# Patient Record
Sex: Male | Born: 1945 | Race: White | Hispanic: No | Marital: Married | Smoking: Never smoker
Health system: Southern US, Community
[De-identification: ages and names within clinical notes are randomized; demographics above are authoritative.]

## PROBLEM LIST (undated history)

## (undated) DIAGNOSIS — J309 Allergic rhinitis, unspecified: Secondary | ICD-10-CM

## (undated) DIAGNOSIS — C61 Malignant neoplasm of prostate: Secondary | ICD-10-CM

## (undated) DIAGNOSIS — E785 Hyperlipidemia, unspecified: Secondary | ICD-10-CM

## (undated) DIAGNOSIS — IMO0002 Reserved for concepts with insufficient information to code with codable children: Secondary | ICD-10-CM

## (undated) DIAGNOSIS — K219 Gastro-esophageal reflux disease without esophagitis: Secondary | ICD-10-CM

## (undated) HISTORY — PX: PROSTATE BIOPSY: SHX241

## (undated) HISTORY — PX: OTHER SURGICAL HISTORY: SHX169

## (undated) HISTORY — DX: Hyperlipidemia, unspecified: E78.5

## (undated) HISTORY — DX: Reserved for concepts with insufficient information to code with codable children: IMO0002

## (undated) HISTORY — DX: Allergic rhinitis, unspecified: J30.9

## (undated) HISTORY — DX: Gastro-esophageal reflux disease without esophagitis: K21.9

---

## 2000-01-04 ENCOUNTER — Ambulatory Visit (HOSPITAL_BASED_OUTPATIENT_CLINIC_OR_DEPARTMENT_OTHER): Admission: RE | Admit: 2000-01-04 | Discharge: 2000-01-04 | Payer: Self-pay | Admitting: Plastic Surgery

## 2006-01-25 ENCOUNTER — Encounter: Admission: RE | Admit: 2006-01-25 | Discharge: 2006-01-25 | Payer: Self-pay | Admitting: Orthopedic Surgery

## 2006-08-02 ENCOUNTER — Encounter: Admission: RE | Admit: 2006-08-02 | Discharge: 2006-08-02 | Payer: Self-pay | Admitting: Specialist

## 2012-02-22 ENCOUNTER — Encounter: Payer: Self-pay | Admitting: Internal Medicine

## 2012-10-05 ENCOUNTER — Encounter: Payer: Self-pay | Admitting: Internal Medicine

## 2015-01-22 ENCOUNTER — Ambulatory Visit (INDEPENDENT_AMBULATORY_CARE_PROVIDER_SITE_OTHER): Payer: Medicare Other | Admitting: Cardiology

## 2015-01-22 ENCOUNTER — Encounter: Payer: Self-pay | Admitting: Cardiology

## 2015-01-22 VITALS — BP 112/66 | HR 72 | Ht 70.0 in | Wt 145.1 lb

## 2015-01-22 DIAGNOSIS — Z8249 Family history of ischemic heart disease and other diseases of the circulatory system: Secondary | ICD-10-CM

## 2015-01-22 DIAGNOSIS — I10 Essential (primary) hypertension: Secondary | ICD-10-CM | POA: Insufficient documentation

## 2015-01-22 DIAGNOSIS — E785 Hyperlipidemia, unspecified: Secondary | ICD-10-CM | POA: Diagnosis not present

## 2015-01-22 NOTE — Progress Notes (Signed)
Cardiology Office Note   Date:  01/22/2015   ID:  Alexander Reynolds, DOB Nov 01, 1945, MRN [id number]  PCP:  Cari Caraway, MD  Cardiologist:   Candee Furbish, MD       History of Present Illness: Alexander Reynolds is a 69 y.o. male who presents for evaluation for prevention of coronary artery disease. Has a history of hyperlipidemia, Niaspan, Crestor, hypertension on lisinopril.  Overall he is been doing fairly well since our last cardio evaluation in 2011. He had a nuclear stress test done in the past, low risk, possible diaphragmatic attenuation.  He bikes a couple times a week, 15 miles in the morning. No significant anginal symptoms. No shortness of breath.   Creatinine 1.09, ALT 30, PSA normal, nonsmoker. Father died when he was 15 with prostate cancer mother had atrial fibrillation at age 23. LDL cholesterol is 114 has been on Crestor 20 mg 1 tablet for 3 days, half tablet for 4 days. Also on Niaspan as well. We discussed particle size.  When taking higher dose atorvastatin or continuous higher dose Crestor, he has a sensation in his chest wall/rib cage like he is unable to take a deep breath.    Past Medical History  Diagnosis Date  . Hyperlipidemia   . GERD (gastroesophageal reflux disease)   . Squamous cell carcinoma   . Allergic rhinitis     Past Surgical History  Procedure Laterality Date  . Skin cancer removals       Current Outpatient Prescriptions  Medication Sig Dispense Refill  . aspirin 81 MG tablet Take 81 mg by mouth daily.    . calcium carbonate (OS-CAL) 600 MG TABS tablet Take 600 mg by mouth 2 (two) times daily with a meal.    . Cholecalciferol (VITAMIN D) 2000 UNITS CAPS Take 2,000 Units by mouth daily.    . fluticasone (FLONASE) 50 MCG/ACT nasal spray Place into both nostrils daily.    Marland Kitchen lisinopril (PRINIVIL,ZESTRIL) 5 MG tablet Take 5 mg by mouth daily.    Marland Kitchen LORazepam (ATIVAN) 0.5 MG tablet Take 0.5 mg by mouth 3 (three) times daily as needed.  (anxiety)    . Multiple Vitamin (MULTIVITAMIN) capsule Take 1 capsule by mouth daily.    . niacin (NIASPAN) 1000 MG CR tablet Take 2,000 mg by mouth at bedtime.     . pantoprazole (PROTONIX) 40 MG tablet Take 40 mg by mouth daily.    . rosuvastatin (CRESTOR) 20 MG tablet Take one (1) tablet (20 mg total) by mouth alternating with half tablet (10 mg total) by mouth daily.     No current facility-administered medications for this visit.    Allergies:   Lipitor and Tetracyclines & related    Social History:  The patient  reports that he has never smoked. He has never used smokeless tobacco.   Family History:  The patient's family history includes Atrial fibrillation in his mother; Diabetes in his father; Heart failure in his father; Leukemia in his mother; Prostate cancer in his father. hyperlipidemia   ROS:  Please see the history of present illness.   Otherwise, review of systems are positive for none.   All other systems are reviewed and negative.    PHYSICAL EXAM: VS:  BP 112/66 mmHg  Pulse 72  Ht 5\' 10"  (1.778 m)  Wt 145 lb 1.9 oz (65.826 kg)  BMI 20.82 kg/m2 , BMI Body mass index is 20.82 kg/(m^2). GEN: Thin, in no acute distress HEENT: normal Neck: no JVD,  carotid bruits, or masses Cardiac: RRR; no murmurs, rubs, or gallops,no edema  Respiratory:  clear to auscultation bilaterally, normal work of breathing GI: soft, nontender, nondistended, + BS MS: no deformity or atrophy Skin: warm and dry, no rash Neuro:  Strength and sensation are intact Psych: euthymic mood, full affect   EKG:  EKG is ordered today. The ekg ordered today demonstrates 01/22/15-sinus rhythm, 72, no other abnormalities   Recent Labs: No results found for requested labs within last 365 days.    Lipid Panel No results found for: CHOL, TRIG, HDL, CHOLHDL, VLDL, LDLCALC, LDLDIRECT    Wt Readings from Last 3 Encounters:  01/22/15 145 lb 1.9 oz (65.826 kg)      Other studies Reviewed: Additional  studies/ records that were reviewed today include: Prior lab work, prior office notes. Review of the above records demonstrates: As above   ASSESSMENT AND PLAN:  1.  Family history of CAD/hyperlipidemia/atrial fibrillation/heart failure-it is been several years since prior evaluation, we will go ahead and check an exercise treadmill test to ensure that there are no high risk features. He is quite active, cycling. We want to ensure that he is safe during this activity.  2. Hyperlipidemia-writer intolerance to higher dose atorvastatin. He seems to be tolerating his Crestor with his current regimen well. We discussed Niaspan and I have no objection to continuing at this time.  3. Essential hypertension-currently excellent control with 5 mg lisinopril. Upon next visit with Dr. Leonides Schanz, He may actually be able to discontinu low-dose lisinopril. This was started surrounding an orthopedic visit and increased stress.   Current medicines are reviewed at length with the patient today.  The patient does not have concerns regarding medicines.  The following changes have been made:  no change  Labs/ tests ordered today include: Exercise treadmill test   Orders Placed This Encounter  Procedures  . Exercise Tolerance Test  . EKG 12-Lead     Disposition:   FU with Emonii Wienke in 1 year  Signed, Candee Furbish, MD  01/22/2015 12:15 PM    [facility] [place], [place], [place]  [address] Phone: [phone]; Fax: [phone]

## 2015-01-22 NOTE — Patient Instructions (Signed)
Medication Instructions:  Your physician recommends that you continue on your current medications as directed. Please refer to the Current Medication list given to you today.  Testing/Procedures: Your physician has requested that you have an exercise tolerance test. For further information please visit [email or web address]. Please also follow instruction sheet, as given.  Follow-Up: Follow up in 1 year with Dr. Marlou Porch.  You will receive a letter in the mail 2 months before you are due.  Please call us when you receive this letter to schedule your follow up appointment.  Thank you for choosing [facility]!!

## 2015-02-11 ENCOUNTER — Telehealth (HOSPITAL_COMMUNITY): Payer: Self-pay

## 2015-02-11 NOTE — Telephone Encounter (Signed)
Encounter complete. 

## 2015-02-13 ENCOUNTER — Ambulatory Visit (HOSPITAL_COMMUNITY)
Admission: RE | Admit: 2015-02-13 | Discharge: 2015-02-13 | Disposition: A | Discharge status: Home / Self Care | Payer: Medicare Other | Source: Ambulatory Visit | Attending: Cardiovascular Disease | Admitting: Cardiovascular Disease

## 2015-02-13 DIAGNOSIS — Z8249 Family history of ischemic heart disease and other diseases of the circulatory system: Secondary | ICD-10-CM | POA: Diagnosis not present

## 2015-02-14 LAB — EXERCISE TOLERANCE TEST
CHL CUP MPHR: 152 {beats}/min
CSEPED: 7 min
CSEPEDS: 1 s
CSEPEW: 8.5 METS
CSEPPHR: 143 {beats}/min
Percent HR: 93 %
RPE: 15
Rest HR: 72 {beats}/min

## 2016-04-07 ENCOUNTER — Ambulatory Visit (INDEPENDENT_AMBULATORY_CARE_PROVIDER_SITE_OTHER): Payer: Medicare Other | Admitting: Cardiology

## 2016-04-07 ENCOUNTER — Encounter: Payer: Self-pay | Admitting: Cardiology

## 2016-04-07 VITALS — BP 124/72 | HR 73 | Ht 69.5 in | Wt 149.6 lb

## 2016-04-07 DIAGNOSIS — I10 Essential (primary) hypertension: Secondary | ICD-10-CM

## 2016-04-07 DIAGNOSIS — E7800 Pure hypercholesterolemia, unspecified: Secondary | ICD-10-CM

## 2016-04-07 DIAGNOSIS — Z8249 Family history of ischemic heart disease and other diseases of the circulatory system: Secondary | ICD-10-CM | POA: Diagnosis not present

## 2016-04-07 NOTE — Progress Notes (Signed)
Cardiology Office Note   Date:  04/07/2016   ID:  GREGORY KOLLMANN, DOB 1945-08-21, MRN [id number]  PCP:  Cari Caraway, MD  Cardiologist:   Candee Furbish, MD       History of Present Illness: Alexander Reynolds is a 70 y.o. male who presents for evaluation for prevention of coronary artery disease. Has a history of hyperlipidemia, Niaspan, Crestor, hypertension on lisinopril.  Overall he is been doing fairly well since our last cardio evaluation. He had a nuclear stress test done in the past, low risk, possible diaphragmatic attenuation. Exercise treadmill test in 2016 was reassuring.  He bikes a couple times a week, 15 miles in the morning. No significant anginal symptoms. No shortness of breath.   Creatinine 1.09, ALT 30, PSA normal, nonsmoker. Father died when he was 37 with prostate cancer mother had atrial fibrillation at age 54. LDL cholesterol is 114 has been on Crestor 20 mg 1 tablet for 3 days, half tablet for 4 days. Also on Niaspan as well. We discussed particle size/trial data.  When taking higher dose atorvastatin or continuous higher dose Crestor, he has a sensation in his chest wall/rib cage like he is unable to take a deep breath.  He has an occasional right flank/rib cage discomfort latissimus dorsi. Fleeting. Musculoskeletal. No associated symptoms.    Past Medical History:  Diagnosis Date  . Allergic rhinitis   . GERD (gastroesophageal reflux disease)   . Hyperlipidemia   . Squamous cell carcinoma     Past Surgical History:  Procedure Laterality Date  . skin cancer removals       Current Outpatient Prescriptions  Medication Sig Dispense Refill  . aspirin 81 MG tablet Take 81 mg by mouth. Patient takes on Sat, Sun Tues, Thurs    . calcium carbonate (OS-CAL) 600 MG TABS tablet Take 600 mg by mouth 2 (two) times daily with a meal.    . Cholecalciferol (VITAMIN D) 2000 UNITS CAPS Take 2,000 Units by mouth daily.    . fluticasone (FLONASE) 50 MCG/ACT nasal  spray Place into both nostrils daily.    Marland Kitchen lisinopril (PRINIVIL,ZESTRIL) 5 MG tablet Take 5 mg by mouth daily.    Marland Kitchen LORazepam (ATIVAN) 0.5 MG tablet Take 0.5 mg by mouth 3 (three) times daily as needed. (anxiety)    . Multiple Vitamin (MULTIVITAMIN) capsule Take 1 capsule by mouth daily.    . niacin (NIASPAN) 1000 MG CR tablet Take 2,000 mg by mouth at bedtime.     . pantoprazole (PROTONIX) 40 MG tablet Take 40 mg by mouth. Patient takes Mon, Wed, Fri    . rosuvastatin (CRESTOR) 20 MG tablet Take one (1) tablet (20 mg total) by mouth alternating with half tablet (10 mg total) by mouth daily.     No current facility-administered medications for this visit.     Allergies:   Lipitor [atorvastatin] and Tetracyclines & related    Social History:  The patient  reports that he has never smoked. He has never used smokeless tobacco.   Family History:  The patient's family history includes Atrial fibrillation in his mother; Diabetes in his father; Heart failure in his father; Leukemia in his mother; Prostate cancer in his father. hyperlipidemia   ROS:  Please see the history of present illness.   Otherwise, review of systems are positive for none.   All other systems are reviewed and negative.    PHYSICAL EXAM: VS:  BP 124/72   Pulse 73   Ht  5' 9.5" (1.765 m)   Wt 149 lb 9.6 oz (67.9 kg)   BMI 21.78 kg/m  , BMI Body mass index is 21.78 kg/m. GEN: Thin, in no acute distress  HEENT: normal  Neck: no JVD, carotid bruits, or masses Cardiac: RRR; no murmurs, rubs, or gallops,no edema  Respiratory:  clear to auscultation bilaterally, normal work of breathing GI: soft, nontender, nondistended, + BS MS: no deformity or atrophy  Skin: warm and dry, no rash Neuro:  Strength and sensation are intact Psych: euthymic mood, full affect   EKG:  EKG is ordered today. The ekg ordered today demonstrates 04/07/16-sinus rhythm 73 with no other abnormality's personally reviewed-prior 01/22/15-sinus rhythm,  72, no other abnormalities   Recent Labs: No results found for requested labs within last 8760 hours.    Lipid Panel No results found for: CHOL, TRIG, HDL, CHOLHDL, VLDL, LDLCALC, LDLDIRECT    Wt Readings from Last 3 Encounters:  04/07/16 149 lb 9.6 oz (67.9 kg)  01/22/15 145 lb 1.9 oz (65.8 kg)      Other studies Reviewed: Additional studies/ records that were reviewed today include: Prior lab work, prior office notes. Review of the above records demonstrates: As above  ETT 02/13/15: There was no ST segment deviation noted during stress.  Arrhythmias during stress: none.  Arrhythmias during recovery: none.  There were no significant arrhythmias noted during the test.  ECG was interpretable and conclusive.  The patient had a normal exercise tolerance. There was no chest pain. There was an appropriate level of dyspnea. There were no arrhythmias, a normal heart rate response and normal BP response. There were no ischemic ST T wave changes and a normal heart rate recovery.  Negative adequate POET (Plain Old Exercise Treadmill)  ASSESSMENT AND PLAN:  1.  Family history of CAD/hyperlipidemia/atrial fibrillation/heart failure-i exercise treadmill test reassuring in 2106.  He is quite active, cycling.   2. Hyperlipidemia- intolerance to higher dose atorvastatin. He seems to be tolerating his Crestor with his current regimen well 20 for 4 and 10 for 3. We discussed Niaspan and I have no objection to continuing at this time (data neutral for MI protection).  3. Essential hypertension-currently excellent control with 5 mg lisinopril. Upon next visit with Dr. Leonides Schanz, He may actually be able to discontinu low-dose lisinopril. This was started surrounding increased stress (daughter separation).   Current medicines are reviewed at length with the patient today.  The patient does not have concerns regarding medicines.   Orders Placed This Encounter  Procedures  . EKG 12-Lead      Disposition:   FU with Alga [place] in 2 years. No need for stress test at this time  Signed, Candee Furbish, MD  04/07/2016 10:02 AM    [facility] [place], [place], [place]  [address] Phone: [phone]; Fax: [phone]

## 2016-04-07 NOTE — Patient Instructions (Signed)
Medication Instructions:  The current medical regimen is effective;  continue present plan and medications.  Follow-Up: Follow up in 2 years with Dr. Skains.  You will receive a letter in the mail 2 months before you are due.  Please call us when you receive this letter to schedule your follow up appointment.  If you need a refill on your cardiac medications before your next appointment, please call your pharmacy.  Thank you for choosing [facility]!!     

## 2016-11-11 ENCOUNTER — Ambulatory Visit (INDEPENDENT_AMBULATORY_CARE_PROVIDER_SITE_OTHER): Payer: Medicare Other | Admitting: Orthopaedic Surgery

## 2016-11-11 ENCOUNTER — Ambulatory Visit (INDEPENDENT_AMBULATORY_CARE_PROVIDER_SITE_OTHER): Payer: Medicare Other

## 2016-11-11 ENCOUNTER — Encounter (INDEPENDENT_AMBULATORY_CARE_PROVIDER_SITE_OTHER): Payer: Self-pay | Admitting: Orthopaedic Surgery

## 2016-11-11 VITALS — BP 128/73 | HR 70 | Resp 14 | Ht 70.0 in | Wt 150.0 lb

## 2016-11-11 DIAGNOSIS — R0781 Pleurodynia: Secondary | ICD-10-CM | POA: Diagnosis not present

## 2016-11-11 DIAGNOSIS — S20211A Contusion of right front wall of thorax, initial encounter: Secondary | ICD-10-CM | POA: Diagnosis not present

## 2016-11-11 NOTE — Progress Notes (Signed)
Office Visit Note   Patient: Alexander Reynolds           Date of Birth: 04-01-1946           MRN: [id number] Visit Date: 11/11/2016              Requested by: Cari Caraway, [place], [place] [address] PCP: Cari Caraway, MD   Assessment & Plan: Visit Diagnoses:  1. Rib contusion, right, initial encounter   2. Rib pain on right side     Plan:  #1: No treatment at this time since he is improving. #2: If he continues to have pain or worsens then we may need to consider a CT scanning or bone scanning.  Follow-Up Instructions: Return if symptoms worsen or fail to improve.   Orders:  Orders Placed This Encounter  Procedures  . XR Ribs Unilateral Right   No orders of the defined types were placed in this encounter.     Procedures: No procedures performed   Clinical Data: No additional findings.   Subjective: Chief Complaint  Patient presents with  . Chest - Injury, Pain    Alexander Reynolds is a 71 y o that presents with Right rib pain. He relates 10 days ago he "bumped" a desk but pain when taking deep breath.    Alexander Reynolds is a 71 year old white male who is seen today for evaluation of his right upper and lateral chest pain. Apparently 10 days ago he was walking and stumbled and caught a high table corner into his right lateral and anterior ribs. He didn't think much about that today he did not have excruciating pain and continued on. However then he started having a little bit more pain. He has even gone off golf without pain. He has a trip coming up to [place] and doing golfing out there concerned and so he Reynolds today for evaluation. Today he has minimal pain by his history. Denies shortness of breath or dyspnea.    Review of Systems  All other systems reviewed and are negative.    Objective: Vital Signs: BP 128/73   Pulse 70   Resp 14   Ht 5\' 10"  (1.778 m)   Wt 150 lb (68 kg)   BMI 21.52 kg/m   Physical Exam  Constitutional: He is oriented  to person, place, and time. He appears well-developed and well-nourished.  HENT:  Head: Normocephalic and atraumatic.  Eyes: EOM are normal. Pupils are equal, round, and reactive to light.  Pulmonary/Chest: Effort normal.  Neurological: He is alert and oriented to person, place, and time.  Skin: Skin is warm and dry.  Psychiatric: He has a normal mood and affect. His behavior is normal. Judgment and thought content normal.    Ortho Exam he is minimally tender over the area of the anterior and lateral aspect of the right hemithorax. A marker was placed for x-ray review. At that place he has minimal tenderness and this seems to be more over the a bony prominence. He doesn't have intercostal pain. He can take a deep breath without having any pain. He denies shortness of breath that here in the office also  Specialty Comments:  No specialty comments available.  Imaging: Xr Ribs Unilateral Right  Result Date: 11/11/2016 Two-view chest x-ray and rib details with marker does not reveal any occult fracture at this time. This was reviewed both by Dr. Durward Fortes myself. Lung markings are appropriate. No effusions noted.    [place]  History: Patient Active Problem List   Diagnosis Date Noted  . Family history of coronary artery disease 01/22/2015  . Essential hypertension 01/22/2015  . Hyperlipidemia 01/22/2015   Past Medical History:  Diagnosis Date  . Allergic rhinitis   . GERD (gastroesophageal reflux disease)   . Hyperlipidemia   . Squamous cell carcinoma     Family History  Problem Relation Age of Onset  . Atrial fibrillation Mother   . Leukemia Mother   . Prostate cancer Father   . Heart failure Father   . Diabetes Father     Past Surgical History:  Procedure Laterality Date  . skin cancer removals     Social History   Occupational History  . Not on file.   Social History Main Topics  . Smoking status: Never Smoker  . Smokeless tobacco: Never Used  . Alcohol use Not on  file  . Drug use: Unknown  . Sexual activity: Not on file

## 2016-12-24 NOTE — Addendum Note (Signed)
Addended by: Mervyn Skeeters on: 12/24/2016 12:26 PM   Modules accepted: Level of Service

## 2018-04-03 ENCOUNTER — Encounter: Payer: Self-pay | Admitting: Cardiology

## 2018-04-25 ENCOUNTER — Encounter: Payer: Self-pay | Admitting: Cardiology

## 2018-04-25 ENCOUNTER — Ambulatory Visit: Payer: Medicare Other | Admitting: Cardiology

## 2018-04-25 VITALS — BP 122/76 | HR 73 | Ht 70.0 in | Wt 151.0 lb

## 2018-04-25 DIAGNOSIS — I10 Essential (primary) hypertension: Secondary | ICD-10-CM

## 2018-04-25 DIAGNOSIS — E7800 Pure hypercholesterolemia, unspecified: Secondary | ICD-10-CM | POA: Diagnosis not present

## 2018-04-25 DIAGNOSIS — Z8249 Family history of ischemic heart disease and other diseases of the circulatory system: Secondary | ICD-10-CM | POA: Diagnosis not present

## 2018-04-25 NOTE — Patient Instructions (Signed)
Medication Instructions:  The current medical regimen is effective;  continue present plan and medications.  If you need a refill on your cardiac medications before your next appointment, please call your pharmacy.   Testing/Procedures: Your physician has requested that you have calcium score.  This is completed by cardiac computed tomography (CT) which is a painless test that uses an x-ray machine to take clear, detailed pictures of your heart. The cost of this test is $150.00 and is not covered by your insurance.  You have been referred to the [facility].   Follow-Up: At [facility], you and your health needs are our priority.  As part of our continuing mission to provide you with exceptional heart care, we have created designated Provider Care Teams.  These Care Teams include your primary Cardiologist (physician) and Advanced Practice Providers (APPs -  Physician Assistants and Nurse Practitioners) who all work together to provide you with the care you need, when you need it. You will need a follow up appointment in 12 months.  Please call our office 2 months in advance to schedule this appointment.  You may see Dr Marlou Porch. or one of the following Advanced Practice Providers on your designated Care Team:   Truitt Merle, NP Cecilie Kicks, NP . Kathyrn Drown, NP  Thank you for choosing [facility]!!

## 2018-04-25 NOTE — Progress Notes (Signed)
Cardiology Office Note   Date:  04/25/2018   ID:  [name], DOB 12/11/1945, MRN [id number]  PCP:  Cari Caraway, MD  Cardiologist:   Candee Furbish, MD       History of Present Illness: Alexander Reynolds is a 72 y.o. male who presents for follow-up of prevention of coronary artery disease. Has a history of hyperlipidemia, Niaspan, Crestor, hypertension on lisinopril.  He had a nuclear stress test done in the past, low risk, possible diaphragmatic attenuation. Exercise treadmill test in 2016 was reassuring.  He bikes a couple times a week, 15 miles in the morning.  He hikes when it is too cold.  Lipid profile from 11/01/2017- LDL-P-1559 high (would like <1000), LDL-C 98, HDL-C 52, triglycerides 192 total cholesterol 188 small LDL-P 1143 LDL size 20 small.  Lipoprotein score 67 insulin resistance elevated.  ALT 28 potassium 4.6 .PSA normal, nonsmoker. Father died when he was 82 with prostate cancer mother had atrial fibrillation at age 29. LDL cholesterol is 114 has been on Crestor 20 mg 1 tablet for 3 days, half tablet for 4 days. Was on Niaspan as well at one time but this was discontinued. We discussed particle size/trial data.  When taking higher dose atorvastatin or continuous higher dose Crestor, he has a sensation in his chest wall/rib cage like he is unable to take a deep breath.  Prior CT scan in 2008 did show some aortic atherosclerosis.    Past Medical History:  Diagnosis Date  . Allergic rhinitis   . GERD (gastroesophageal reflux disease)   . Hyperlipidemia   . Squamous cell carcinoma     Past Surgical History:  Procedure Laterality Date  . skin cancer removals       Current Outpatient Medications  Medication Sig Dispense Refill  . aspirin 81 MG tablet Take 81 mg by mouth. Patient takes on Sat, Sun Tues, Thurs    . calcium carbonate (OS-CAL) 600 MG TABS tablet Take 600 mg by mouth 2 (two) times daily with a meal.    . Cholecalciferol (VITAMIN D) 2000 UNITS  CAPS Take 2,000 Units by mouth daily.    Marland Kitchen ezetimibe (ZETIA) 10 MG tablet     . fluticasone (FLONASE) 50 MCG/ACT nasal spray Place into both nostrils daily.    Marland Kitchen LORazepam (ATIVAN) 0.5 MG tablet Take 0.5 mg by mouth 3 (three) times daily as needed. (anxiety)    . metoprolol succinate (TOPROL-XL) 25 MG 24 hr tablet Take 25 mg by mouth daily.    . Multiple Vitamin (MULTIVITAMIN) capsule Take 1 capsule by mouth daily.    . pantoprazole (PROTONIX) 40 MG tablet Take 40 mg by mouth. Patient takes Mon, Wed, Fri    . rosuvastatin (CRESTOR) 20 MG tablet Take one (1) tablet (20 mg total) by mouth alternating with half tablet (10 mg total) by mouth daily.     No current facility-administered medications for this visit.     Allergies:   Lipitor [atorvastatin] and Tetracyclines & related    Social History:  The patient  reports that he has never smoked. He has never used smokeless tobacco. He reports that he does not drink alcohol or use drugs.   Family History:  The patient's family history includes Atrial fibrillation in his mother; Diabetes in his father; Heart failure in his father; Leukemia in his mother; Prostate cancer in his father. hyperlipidemia   ROS:  Please see the history of present illness.   Otherwise, review of systems  are positive for none.   All other systems are reviewed and negative.    PHYSICAL EXAM: VS:  BP 122/76   Pulse 73   Ht 5\' 10"  (1.778 m)   Wt 151 lb (68.5 kg)   BMI 21.67 kg/m  , BMI Body mass index is 21.67 kg/m.  GEN: Well nourished, well developed, in no acute distress, thin HEENT: normal  Neck: no JVD, carotid bruits, or masses Cardiac: RRR; no murmurs, rubs, or gallops,no edema  Respiratory:  clear to auscultation bilaterally, normal work of breathing GI: soft, nontender, nondistended, + BS MS: no deformity or atrophy  Skin: warm and dry, no rash Neuro:  Alert and Oriented x 3, Strength and sensation are intact Psych: euthymic mood, full affect   EKG:   EKG is ordered today. The ekg ordered today demonstrates 04/25/2018-sinus rhythm 73 with no other significant abnormalities personally reviewed and interpreted-prior 04/07/16-sinus rhythm 73 with no other abnormality's personally reviewed-prior 01/22/15-sinus rhythm, 72, no other abnormalities   Recent Labs: No results found for requested labs within last 8760 hours.    Lipid Panel No results found for: CHOL, TRIG, HDL, CHOLHDL, VLDL, LDLCALC, LDLDIRECT    Wt Readings from Last 3 Encounters:  04/25/18 151 lb (68.5 kg)  11/11/16 150 lb (68 kg)  04/07/16 149 lb 9.6 oz (67.9 kg)      Other studies Reviewed: Additional studies/ records that were reviewed today include: Prior lab work, prior office notes. Review of the above records demonstrates: As above  ETT 02/13/15: There was no ST segment deviation noted during stress.  Arrhythmias during stress: none.  Arrhythmias during recovery: none.  There were no significant arrhythmias noted during the test.  ECG was interpretable and conclusive.  The patient had a normal exercise tolerance. There was no chest pain. There was an appropriate level of dyspnea. There were no arrhythmias, a normal heart rate response and normal BP response. There were no ischemic ST T wave changes and a normal heart rate recovery.  Negative adequate ETT  ASSESSMENT AND PLAN:  1.  Family history of CAD/hyperlipidemia/atrial fibrillation/heart failure- exercise treadmill test reassuring in 2106.  He is quite active, cycling.   2. Hyperlipidemia- intolerance to higher dose atorvastatin. He seems to be tolerating his Crestor with his current regimen well 20 for 4 and 10 for 3.  He is also on Zetia.  His particle number is still 1500, optimally would like less than 1000.  Because of this, we will go ahead and order a calcium score.  If he has significant calcification/plaque I think we should strongly consider injectable PCSK9 inhibitor.  We will have him follow-up  with Megan upple in lipid clinic  3. Essential hypertension-currently excellent control no significant changes.   No orders of the defined types were placed in this encounter.   Signed, Candee Furbish, MD  04/25/2018 3:26 PM    [address], [place], [place]  [address] Phone: [phone]; Fax: [phone]

## 2018-05-17 NOTE — Progress Notes (Signed)
Patient ID: Alexander Reynolds                 DOB: 1945-07-08                    MRN: [id number]     HPI: Alexander Reynolds is a 72 y.o. male patient referred to lipid clinic by Dr. Marlou Porch. PMH is significant for HLD, afib, CHF, family hx of CAD.   Current Medications: crestor 20mg  4 days a week and 10mg  3 days a week, zetia 10mg  daily Intolerances: higher doses of atovastatin and crestor Risk Factors: awaiting results of cardiac CT LDL goal: <100 pending calcium score  Diet: combo of meals at home/out. Limited fried food (ocassionally), some steak, chicken (baked), does use butter. Water, little beer and wine  Exercise: hike, bike riding 2-3 days a week (7miles), golf  Family History: Atrial fibrillation in his mother; Diabetes in his father; Heart failure in his father; Leukemia in his mother; Prostate cancer in his father. hyperlipidemia  Social History: - tobacco, +ETOH intake  Labs:11/01/2017- LDL-P-1559 high (goal <1000), LDL-C 98, HDL-C 52, triglycerides 192 total cholesterol 188 small LDL-P 1143 LDL size 20 small.  Lipoprotein score 67 insulin resistance elevated  Past Medical History:  Diagnosis Date  . Allergic rhinitis   . GERD (gastroesophageal reflux disease)   . Hyperlipidemia   . Squamous cell carcinoma     Current Outpatient Medications on File Prior to Visit  Medication Sig Dispense Refill  . aspirin 81 MG tablet Take 81 mg by mouth. Patient takes on Sat, Sun Tues, Thurs    . calcium carbonate (OS-CAL) 600 MG TABS tablet Take 600 mg by mouth 2 (two) times daily with a meal.    . Cholecalciferol (VITAMIN D) 2000 UNITS CAPS Take 2,000 Units by mouth daily.    Marland Kitchen ezetimibe (ZETIA) 10 MG tablet     . fluticasone (FLONASE) 50 MCG/ACT nasal spray Place into both nostrils daily.    Marland Kitchen LORazepam (ATIVAN) 0.5 MG tablet Take 0.5 mg by mouth 3 (three) times daily as needed. (anxiety)    . metoprolol succinate (TOPROL-XL) 25 MG 24 hr tablet Take 25 mg by mouth daily.    . Multiple  Vitamin (MULTIVITAMIN) capsule Take 1 capsule by mouth daily.    . pantoprazole (PROTONIX) 40 MG tablet Take 40 mg by mouth. Patient takes Mon, Wed, Fri    . rosuvastatin (CRESTOR) 20 MG tablet Take one (1) tablet (20 mg total) by mouth alternating with half tablet (10 mg total) by mouth daily.     No current facility-administered medications on file prior to visit.     Allergies  Allergen Reactions  . Lipitor [Atorvastatin] Other (See Comments)    REACTION: Sternum aching  . Tetracyclines & Related Rash    Assessment/Plan:  1. Hyperlipidemia -  Discussed with patient results of LDL-P. Patient technically at LDL goal (without results of calcium score), however LDL-P elevated (which puts patient at high risk of artherosclerosis). Results of coronary calcium score pending. Plan is to possibly start a PCSK9 inhibitor. Depending on calcium score result, may have a difficult time getting approved from insurance, may require an appeal. Back-up plan would be to start Vascepa.  Advised patient that alcohol intake can raise TG levels - attempt to limit.

## 2018-05-18 ENCOUNTER — Inpatient Hospital Stay: Admission: RE | Admit: 2018-05-18 | Payer: Medicare Other | Source: Ambulatory Visit

## 2018-05-18 ENCOUNTER — Ambulatory Visit (INDEPENDENT_AMBULATORY_CARE_PROVIDER_SITE_OTHER): Payer: Medicare Other | Admitting: Pharmacist

## 2018-05-18 ENCOUNTER — Ambulatory Visit (INDEPENDENT_AMBULATORY_CARE_PROVIDER_SITE_OTHER)
Admission: RE | Admit: 2018-05-18 | Discharge: 2018-05-18 | Disposition: A | Discharge status: Home / Self Care | Payer: Medicare Other | Source: Ambulatory Visit | Attending: Cardiology | Admitting: Cardiology

## 2018-05-18 DIAGNOSIS — I10 Essential (primary) hypertension: Secondary | ICD-10-CM

## 2018-05-18 DIAGNOSIS — E7800 Pure hypercholesterolemia, unspecified: Secondary | ICD-10-CM

## 2018-05-18 DIAGNOSIS — Z8249 Family history of ischemic heart disease and other diseases of the circulatory system: Secondary | ICD-10-CM

## 2018-05-18 NOTE — Patient Instructions (Addendum)
Based on your cholesterol and results from your CT today we will consider starting Repatha and Praluent.   We contact you once the results are available about options.   If you have questions or concern please call [phone].  IF your triglycerides are still elevated we will consider Vascepa for TG lowering.    Food Choices to Lower Your Triglycerides Triglycerides are a type of fat in your blood. High levels of triglycerides can increase the risk of heart disease and stroke. If your triglyceride levels are high, the foods you eat and your eating habits are very important. Choosing the right foods can help lower your triglycerides. What general guidelines do I need to follow?  Lose weight if you are overweight.  Limit or avoid alcohol.  Fill one half of your plate with vegetables and green salads.  Limit fruit to two servings a day. Choose fruit instead of juice.  Make one fourth of your plate whole grains. Look for the word "whole" as the first word in the ingredient list.  Fill one fourth of your plate with lean protein foods.  Enjoy fatty fish (such as salmon, mackerel, sardines, and tuna) three times a week.  Choose healthy fats.  Limit foods high in starch and sugar.  Eat more home-cooked food and less restaurant, buffet, and fast food.  Limit fried foods.  Cook foods using methods other than frying.  Limit saturated fats.  Check ingredient lists to avoid foods with partially hydrogenated oils (trans fats) in them. What foods can I eat? Grains Whole grains, such as whole wheat or whole grain breads, crackers, cereals, and pasta. Unsweetened oatmeal, bulgur, barley, quinoa, or brown rice. Corn or whole wheat flour tortillas. Vegetables Fresh or frozen vegetables (raw, steamed, roasted, or grilled). Green salads. Fruits All fresh, canned (in natural juice), or frozen fruits. Meat and Other Protein Products Ground beef (85% or leaner), grass-fed beef, or beef  trimmed of fat. Skinless chicken or [place]. Ground chicken or [place]. Pork trimmed of fat. All fish and seafood. Eggs. Dried beans, peas, or lentils. Unsalted nuts or seeds. Unsalted canned or dry beans. Dairy Low-fat dairy products, such as skim or 1% milk, 2% or reduced-fat cheeses, low-fat ricotta or cottage cheese, or plain low-fat yogurt. Fats and Oils Tub margarines without trans fats. Light or reduced-fat mayonnaise and salad dressings. Avocado. Safflower, olive, or canola oils. Natural peanut or almond butter. The items listed above may not be a complete list of recommended foods or beverages. Contact your dietitian for more options. What foods are not recommended? Grains White bread. White pasta. White rice. Cornbread. Bagels, pastries, and croissants. Crackers that contain trans fat. Vegetables White potatoes. Corn. Creamed or fried vegetables. Vegetables in a cheese sauce. Fruits Dried fruits. Canned fruit in light or heavy syrup. Fruit juice. Meat and Other Protein Products Fatty cuts of meat. Ribs, chicken wings, bacon, sausage, bologna, salami, chitterlings, fatback, hot dogs, bratwurst, and packaged luncheon meats. Dairy Whole or 2% milk, cream, half-and-half, and cream cheese. Whole-fat or sweetened yogurt. Full-fat cheeses. Nondairy creamers and whipped toppings. Processed cheese, cheese spreads, or cheese curds. Sweets and Desserts Corn syrup, sugars, honey, and molasses. Candy. Jam and jelly. Syrup. Sweetened cereals. Cookies, pies, cakes, donuts, muffins, and ice cream. Fats and Oils Butter, stick margarine, lard, shortening, ghee, or bacon fat. Coconut, palm kernel, or palm oils. Beverages Alcohol. Sweetened drinks (such as sodas, lemonade, and fruit drinks or punches). The items listed above may not be a complete list of  foods and beverages to avoid. Contact your dietitian for more information. This information is not intended to replace advice given to you by your  health care provider. Make sure you discuss any questions you have with your health care provider. Document Released: 03/11/2004 Document Revised: 10/30/2015 Document Reviewed: 03/28/2013 Elsevier Interactive Patient Education  2017 [employer].

## 2018-05-24 ENCOUNTER — Telehealth: Payer: Self-pay | Admitting: Pharmacist

## 2018-05-24 NOTE — Telephone Encounter (Signed)
-----   Message from Stephani Police, RN sent at 05/22/2018  8:57 AM EST ----- The patient has been notified of the result and verbalized understanding.  All questions (if any) were answered. Will forward to the Lipid clinic.  Stephani Police, RN 05/22/2018 8:56 AM

## 2018-05-24 NOTE — Telephone Encounter (Signed)
Pt with some plaquing and Calcium score>0 and given extensive family history of CV disease will submit for PCSK9i therapy based on discussion a few days ago in lipid clinic under primary hyperlipidemia.   Since he does have some build up and given above an aggressive LDL-C goal of less than 70 can be considered. Also of note there is discord between his LDL-c and LDL-p (goal <700), suggesting that he has more of the atherosclerotic LDL particles.   Will submit for coverage of PCSK9i therapy to reduce risk, given he is on maximum tolerated statin+ezetimibe.

## 2018-05-25 NOTE — Telephone Encounter (Signed)
Spoke with patient and he is aware that we will send for coverage of Repatha. All questions answered about the medications.

## 2018-06-01 MED ORDER — EVOLOCUMAB 140 MG/ML ~~LOC~~ SOAJ: 1.0000 "pen " | SUBCUTANEOUS | 11 refills | Status: DC

## 2018-06-01 NOTE — Telephone Encounter (Signed)
Received call from [place] approved through 12/01/18. Rx sent to pharmacy to determine copay. Pt is aware to call if cost is prohibitive.

## 2018-06-30 ENCOUNTER — Telehealth: Payer: Self-pay

## 2018-06-30 DIAGNOSIS — E785 Hyperlipidemia, unspecified: Secondary | ICD-10-CM

## 2018-06-30 NOTE — Telephone Encounter (Signed)
Scheduled labs for 08/14/18 for repatha

## 2018-08-14 ENCOUNTER — Other Ambulatory Visit: Payer: Medicare Other | Admitting: *Deleted

## 2018-08-14 DIAGNOSIS — E785 Hyperlipidemia, unspecified: Secondary | ICD-10-CM

## 2018-08-14 LAB — HEPATIC FUNCTION PANEL
ALT: 23 IU/L (ref 0–44)
AST: 28 IU/L (ref 0–40)
Albumin: 4.2 g/dL (ref 3.7–4.7)
Alkaline Phosphatase: 73 IU/L (ref 39–117)
BILIRUBIN TOTAL: 0.4 mg/dL (ref 0.0–1.2)
BILIRUBIN, DIRECT: 0.19 mg/dL (ref 0.00–0.40)
TOTAL PROTEIN: 6.5 g/dL (ref 6.0–8.5)

## 2018-08-14 LAB — LIPID PANEL
CHOL/HDL RATIO: 1.5 ratio (ref 0.0–5.0)
Cholesterol, Total: 65 mg/dL — ABNORMAL LOW (ref 100–199)
HDL: 44 mg/dL (ref >39)
LDL Calculated: 11 mg/dL (ref 0–99)
TRIGLYCERIDES: 52 mg/dL (ref 0–149)
VLDL Cholesterol Cal: 10 mg/dL (ref 5–40)

## 2018-08-28 DIAGNOSIS — K3580 Unspecified acute appendicitis: Secondary | ICD-10-CM | POA: Insufficient documentation

## 2018-10-31 ENCOUNTER — Ambulatory Visit: Payer: Medicare Other | Admitting: Orthopaedic Surgery

## 2018-12-18 ENCOUNTER — Telehealth: Payer: Self-pay | Admitting: Pharmacist

## 2018-12-18 DIAGNOSIS — E785 Hyperlipidemia, unspecified: Secondary | ICD-10-CM

## 2018-12-18 MED ORDER — REPATHA SURECLICK 140 MG/ML ~~LOC~~ SOAJ: 1.0000 "pen " | SUBCUTANEOUS | 11 refills | Status: DC

## 2018-12-18 NOTE — Telephone Encounter (Signed)
Pt called clinic stating prior authorization for Repatha had expired. Reauthorization has been submitted, approved shortly after through 06/07/19.

## 2018-12-18 NOTE — Addendum Note (Signed)
Addended by: SUPPLE, MEGAN E on: 12/18/2018 09:49 AM   Modules accepted: Orders

## 2019-04-30 NOTE — Telephone Encounter (Signed)
Scheduled labs for PA renewal on Dec 3

## 2019-04-30 NOTE — Addendum Note (Signed)
Addended by: Marcelle Overlie D on: 04/30/2019 09:18 AM   Modules accepted: Orders

## 2019-05-10 ENCOUNTER — Other Ambulatory Visit: Payer: Self-pay

## 2019-05-10 ENCOUNTER — Other Ambulatory Visit: Payer: Medicare Other | Admitting: *Deleted

## 2019-05-10 DIAGNOSIS — E785 Hyperlipidemia, unspecified: Secondary | ICD-10-CM

## 2019-05-10 LAB — HEPATIC FUNCTION PANEL
ALT: 28 IU/L (ref 0–44)
AST: 29 IU/L (ref 0–40)
Albumin: 4.4 g/dL (ref 3.7–4.7)
Alkaline Phosphatase: 64 IU/L (ref 39–117)
Bilirubin Total: 0.6 mg/dL (ref 0.0–1.2)
Bilirubin, Direct: 0.19 mg/dL (ref 0.00–0.40)
Total Protein: 6.6 g/dL (ref 6.0–8.5)

## 2019-05-10 LAB — LIPID PANEL
Chol/HDL Ratio: 1.3 ratio (ref 0.0–5.0)
Cholesterol, Total: 79 mg/dL — ABNORMAL LOW (ref 100–199)
HDL: 59 mg/dL (ref >39)
LDL Chol Calc (NIH): 4 mg/dL (ref 0–99)
Triglycerides: 73 mg/dL (ref 0–149)
VLDL Cholesterol Cal: 16 mg/dL (ref 5–40)

## 2019-11-06 ENCOUNTER — Other Ambulatory Visit: Payer: Self-pay | Admitting: Cardiology

## 2019-11-06 NOTE — Telephone Encounter (Signed)
Refill for Repatha received.  Last office visit 04/25/18.  Will refill for one month.  Please call to schedule

## 2019-11-19 ENCOUNTER — Telehealth: Payer: Self-pay | Admitting: Pharmacist

## 2019-11-19 MED ORDER — REPATHA SURECLICK 140 MG/ML ~~LOC~~ SOAJ: 1.0000 | SUBCUTANEOUS | 11 refills | Status: DC

## 2019-11-19 NOTE — Telephone Encounter (Signed)
Pt called clinic to cancel upcoming lipid appt - looks like this was scheduled since pt has not been seen since 2019.   Pt had lipid panel drawn at PCP on 11/06/19: TC 79, HDL 48, LDL 10, TG 113.  Pt currently taking rosuvastatin 10mg /20mg  alternating every other day (myalgias on 20mg  daily), ezetimibe 10mg  daily, and Repatha 140mg  Q2W.  Discussed stopping ezetimibe since LDL is 10. Pt prefers to continue this. Instead will decrease rosuvastatin to 10mg  every day and will continue ezetimibe 10mg  daily and Repatha 140mg  Q2W. Also provided him with [facility] # to apply for assistance (I was blocked out of online application based on income, he may be close to the cutoff but encouraged him to still call and apply).  He will keep follow up with Dr Marlou Porch.

## 2019-11-26 ENCOUNTER — Ambulatory Visit: Payer: Medicare Other

## 2019-12-17 ENCOUNTER — Encounter: Payer: Self-pay | Admitting: Gastroenterology

## 2019-12-17 NOTE — Telephone Encounter (Signed)
OPENED IN ERROR

## 2020-01-04 ENCOUNTER — Telehealth: Payer: Self-pay | Admitting: Pharmacist

## 2020-01-04 NOTE — Telephone Encounter (Signed)
Patient called, had lipid profile run at PCP.  LDL at 7 on 12/31/29, currently on Repatha 140 mg biweekly, zetia 10 mg daily, and rosuvstatin 10 mg daily.  Due to low LDL from last December rosuvastatin was reduced from 20 mg to 10 mg.  Patient wondered if it should be reduced more. Recommended patient discontinue Zetia instead due to the benefits of rosuvastatin besides LDL lowering.  Patient voiced understanding.

## 2020-01-09 ENCOUNTER — Ambulatory Visit: Payer: Medicare Other | Admitting: Cardiology

## 2020-01-29 ENCOUNTER — Other Ambulatory Visit: Payer: Self-pay | Admitting: Urology

## 2020-01-29 DIAGNOSIS — R972 Elevated prostate specific antigen [PSA]: Secondary | ICD-10-CM

## 2020-01-29 DIAGNOSIS — N4232 Atypical small acinar proliferation of prostate: Secondary | ICD-10-CM

## 2020-02-06 ENCOUNTER — Ambulatory Visit: Payer: Medicare Other | Admitting: Gastroenterology

## 2020-02-13 ENCOUNTER — Ambulatory Visit: Payer: Medicare Other | Admitting: Cardiology

## 2020-02-13 NOTE — Progress Notes (Deleted)
Cardiology Office Note:    Date:  02/13/2020   ID:  Alexander Reynolds, DOB 03/29/1946, MRN [id number]  PCP:  Cari Caraway, MD  [facility] Cardiologist:  Candee Furbish, MD  [facility] Electrophysiologist:  None   Referring MD: Cari Caraway, MD    History of Present Illness:    Alexander Reynolds is a 74 y.o. male with aortic atherosclerosis, statin intolerance -- Now on Repatha   Higher dose Crestor, sensation of chest wall pain with deep breath    Past Medical History:  Diagnosis Date   Allergic rhinitis    GERD (gastroesophageal reflux disease)    Hyperlipidemia    Squamous cell carcinoma     Past Surgical History:  Procedure Laterality Date   skin cancer removals      Current Medications: No outpatient medications have been marked as taking for the 02/13/20 encounter (Appointment) with Jerline Pain, MD.     Allergies:   Lipitor [atorvastatin] and Tetracyclines & related   Social History   Socioeconomic History   Marital status: Married    Spouse name: Not on file   Number of children: Not on file   Years of education: Not on file   Highest education level: Not on file  Occupational History   Not on file  Tobacco Use   Smoking status: Never Smoker   Smokeless tobacco: Never Used  Vaping Use   Vaping Use: Never used  Substance and Sexual Activity   Alcohol use: Never    Alcohol/week: 0.0 standard drinks   Drug use: Never   Sexual activity: Not on file  Other Topics Concern   Not on file  Social History Narrative   Not on file   Social Determinants of Health   Financial Resource Strain:    Difficulty of Paying Living Expenses: Not on file  Food Insecurity:    Worried About [place] in the Last Year: Not on file   [place] in the Last Year: Not on file  Transportation Needs:    Lack of Transportation (Medical): Not on file   Lack of Transportation (Non-Medical): Not on file  Physical Activity:    Days  of Exercise per Week: Not on file   Minutes of Exercise per Session: Not on file  Stress:    Feeling of Stress : Not on file  Social Connections:    Frequency of Communication with Friends and Family: Not on file   Frequency of Social Gatherings with Friends and Family: Not on file   Attends Religious Services: Not on file   Active Member of Clubs or Organizations: Not on file   Attends Archivist Meetings: Not on file   Marital Status: Not on file     Family History: The patient's ***family history includes Atrial fibrillation in his mother; Diabetes in his father; Heart failure in his father; Leukemia in his mother; Prostate cancer in his father.  ROS:   Please see the history of present illness.    *** All other systems reviewed and are negative.  EKGs/Labs/Other Studies Reviewed:    The following studies were reviewed today:  ETT 2016:  Negative     EKG:  EKG is *** ordered today.  The ekg ordered today demonstrates ***  Recent Labs: 05/10/2019: ALT 28  Recent Lipid Panel    Component Value Date/Time   CHOL 79 (L) 05/10/2019 0848   TRIG 73 05/10/2019 0848   HDL 59 05/10/2019 0848  CHOLHDL 1.3 05/10/2019 0848   LDLCALC 4 05/10/2019 0848    Physical Exam:    VS:  There were no vitals taken for this visit.    Wt Readings from Last 3 Encounters:  04/25/18 151 lb (68.5 kg)  11/11/16 150 lb (68 kg)  04/07/16 149 lb 9.6 oz (67.9 kg)     GEN: *** Well nourished, well developed in no acute distress HEENT: Normal NECK: No JVD; No carotid bruits LYMPHATICS: No lymphadenopathy CARDIAC: ***RRR, no murmurs, rubs, gallops RESPIRATORY:  Clear to auscultation without rales, wheezing or rhonchi  ABDOMEN: Soft, non-tender, non-distended MUSCULOSKELETAL:  No edema; No deformity  SKIN: Warm and dry NEUROLOGIC:  Alert and oriented x 3 PSYCHIATRIC:  Normal affect   ASSESSMENT:    1. Coronary artery calcification   2. Pure hypercholesterolemia   3.  Family history of coronary artery disease    PLAN:    In order of problems listed above:  Coronary calcification/ CAD - 2019 CT--IMPRESSION: Coronary calcium score of 73. This was 1 th percentile for age and sex matched control.  Family history of CAD --aggressive risk factor prevention  Hyperlipidemia --atorvastatin and Crestor intolerant at higher dose --Now repatha    Medication Adjustments/Labs and Tests Ordered: Current medicines are reviewed at length with the patient today.  Concerns regarding medicines are outlined above.  No orders of the defined types were placed in this encounter.  No orders of the defined types were placed in this encounter.   There are no Patient Instructions on file for this visit.   Signed, Candee Furbish, MD  02/13/2020 7:28 AM    [facility]

## 2020-02-20 ENCOUNTER — Ambulatory Visit
Admission: RE | Admit: 2020-02-20 | Discharge: 2020-02-20 | Disposition: A | Discharge status: Home / Self Care | Payer: Medicare Other | Source: Ambulatory Visit | Attending: Urology | Admitting: Urology

## 2020-02-20 DIAGNOSIS — N4232 Atypical small acinar proliferation of prostate: Secondary | ICD-10-CM

## 2020-02-20 DIAGNOSIS — R972 Elevated prostate specific antigen [PSA]: Secondary | ICD-10-CM

## 2020-02-20 MED ORDER — GADOBENATE DIMEGLUMINE 529 MG/ML IV SOLN
20.0000 mL | Freq: Once | INTRAVENOUS | Status: AC | PRN
  Administered 2020-02-20: 20 mL via INTRAVENOUS

## 2020-02-25 ENCOUNTER — Ambulatory Visit: Payer: Medicare Other | Admitting: Dermatology

## 2020-02-27 ENCOUNTER — Telehealth: Payer: Self-pay | Admitting: Cardiology

## 2020-03-12 ENCOUNTER — Ambulatory Visit: Payer: Medicare Other | Admitting: Dermatology

## 2020-03-12 ENCOUNTER — Encounter: Payer: Self-pay | Admitting: Dermatology

## 2020-03-12 ENCOUNTER — Other Ambulatory Visit: Payer: Self-pay

## 2020-03-12 DIAGNOSIS — D485 Neoplasm of uncertain behavior of skin: Secondary | ICD-10-CM

## 2020-03-12 DIAGNOSIS — Z85828 Personal history of other malignant neoplasm of skin: Secondary | ICD-10-CM

## 2020-03-12 DIAGNOSIS — L570 Actinic keratosis: Secondary | ICD-10-CM | POA: Diagnosis not present

## 2020-03-12 DIAGNOSIS — D489 Neoplasm of uncertain behavior, unspecified: Secondary | ICD-10-CM

## 2020-03-12 DIAGNOSIS — C4491 Basal cell carcinoma of skin, unspecified: Secondary | ICD-10-CM

## 2020-03-12 DIAGNOSIS — Z1283 Encounter for screening for malignant neoplasm of skin: Secondary | ICD-10-CM

## 2020-03-12 DIAGNOSIS — C44319 Basal cell carcinoma of skin of other parts of face: Secondary | ICD-10-CM

## 2020-03-12 DIAGNOSIS — IMO0002 Reserved for concepts with insufficient information to code with codable children: Secondary | ICD-10-CM

## 2020-03-12 DIAGNOSIS — D0439 Carcinoma in situ of skin of other parts of face: Secondary | ICD-10-CM | POA: Diagnosis not present

## 2020-03-12 HISTORY — DX: Reserved for concepts with insufficient information to code with codable children: IMO0002

## 2020-03-12 HISTORY — DX: Basal cell carcinoma of skin, unspecified: C44.91

## 2020-03-12 NOTE — Progress Notes (Signed)
LN2   x1 on left siburn x1 on right cheek x2 on nose

## 2020-03-12 NOTE — Patient Instructions (Signed)

## 2020-03-17 ENCOUNTER — Telehealth: Payer: Self-pay | Admitting: *Deleted

## 2020-03-17 NOTE — Telephone Encounter (Signed)
Path to patient. Made surgery appointment with Dr.Tafeen.  

## 2020-03-17 NOTE — Telephone Encounter (Signed)
-----   Message from Alexander Monarch, MD sent at 03/15/2020 12:29 PM EDT ----- Schedule last surgery of a.m. or p.m. with Dr. Darene Lamer for #1+ #4.  I will decide at the visit on the appropriate way to treat #3.

## 2020-03-17 NOTE — Telephone Encounter (Signed)
-----   Message from [name], MD sent at 03/15/2020 12:29 PM EDT ----- Schedule last surgery of a.m. or p.m. with Dr. Darene Lamer for #1+ #4.  I will decide at the visit on the appropriate way to treat #3.

## 2020-03-24 ENCOUNTER — Ambulatory Visit: Payer: Medicare Other | Admitting: Dermatology

## 2020-04-18 ENCOUNTER — Encounter: Payer: Self-pay | Admitting: Dermatology

## 2020-04-18 NOTE — Progress Notes (Signed)
Follow-Up Visit   Subjective  Alexander Reynolds is a 74 y.o. male who presents for the following: Annual Exam (mid forehead- x months, under left eye- spot, left temple- red spot, left sideburn- ? tag).  Multiple new growths Location: Face and torso Duration:  Quality:  Associated Signs/Symptoms: Modifying Factors:  Severity:  Timing: Context: History of multiple nonmelanoma skin cancers and several Mohs surgeries  Objective  Well appearing patient in no apparent distress; mood and affect are within normal limits.  All skin waist up examined.   Assessment & Plan    Neoplasm of uncertain behavior (4) Left Temple Superior  Skin / nail biopsy Type of biopsy: tangential   Informed consent: discussed and consent obtained   Timeout: patient name, date of birth, surgical site, and procedure verified   Anesthesia: the lesion was anesthetized in a standard fashion   Anesthetic:  1% lidocaine w/ epinephrine 1-100,000 local infiltration Instrument used: flexible razor blade   Hemostasis achieved with: ferric subsulfate   Outcome: patient tolerated procedure well   Post-procedure details: sterile dressing applied and wound care instructions given   Dressing type: bandage and petrolatum    Specimen 3 - Surgical pathology Differential Diagnosis: BCC SCC Check Margins: No  Left Temple Inferior  Skin / nail biopsy Type of biopsy: tangential   Informed consent: discussed and consent obtained   Timeout: patient name, date of birth, surgical site, and procedure verified   Anesthesia: the lesion was anesthetized in a standard fashion   Anesthetic:  1% lidocaine w/ epinephrine 1-100,000 local infiltration Instrument used: flexible razor blade   Hemostasis achieved with: ferric subsulfate   Outcome: patient tolerated procedure well   Post-procedure details: sterile dressing applied and wound care instructions given   Dressing type: bandage and petrolatum    Specimen 4 - Surgical  pathology Differential Diagnosis: BCC SCC Check Margins: No  Left Malar Cheek  Skin / nail biopsy Type of biopsy: tangential   Informed consent: discussed and consent obtained   Timeout: patient name, date of birth, surgical site, and procedure verified   Anesthesia: the lesion was anesthetized in a standard fashion   Anesthetic:  1% lidocaine w/ epinephrine 1-100,000 local infiltration Instrument used: flexible razor blade   Hemostasis achieved with: ferric subsulfate   Outcome: patient tolerated procedure well   Post-procedure details: sterile dressing applied and wound care instructions given   Dressing type: bandage and petrolatum    Specimen 2 - Surgical pathology Differential Diagnosis: BCC SCC Check Margins: No  Left Forehead  Skin / nail biopsy Type of biopsy: tangential   Informed consent: discussed and consent obtained   Timeout: patient name, date of birth, surgical site, and procedure verified   Anesthesia: the lesion was anesthetized in a standard fashion   Anesthetic:  1% lidocaine w/ epinephrine 1-100,000 local infiltration Instrument used: flexible razor blade   Hemostasis achieved with: ferric subsulfate   Outcome: patient tolerated procedure well   Post-procedure details: sterile dressing applied and wound care instructions given   Dressing type: bandage and petrolatum    Specimen 1 - Surgical pathology Differential Diagnosis: BCC SCC Check Margins: No  AK (actinic keratosis) (4) Left Preauricular Area; Mid Supratip of Nose (2); Right Buccal Cheek   Defer intervention  Encounter for screening for malignant neoplasm of skin (2) Left Upper Arm - Anterior; Left Breast  Four facial biopsies obtained, defer  others.     I, [name], MD, have reviewed all documentation for this visit.  The documentation on 04/18/20 for the exam, diagnosis, procedures, and orders are all accurate and complete.

## 2020-04-21 NOTE — Progress Notes (Addendum)
Cardiology Office Note:    Date:  04/23/2020   ID:  Alexander Reynolds, DOB 08/07/1945, MRN [id number]  PCP:  Cari Caraway, MD  [facility] Cardiologist:  Candee Furbish, MD  [facility] Electrophysiologist:  None   Referring MD: Cari Caraway, MD     History of Present Illness:    Alexander Reynolds is a 74 y.o. male here for the follow-up of coronary artery disease with hyperlipidemia.  Coronary calcium score in 05/18/2018:Coronary arteries: Calcium noted in proximal LAD and circumflex  IMPRESSION: Coronary calcium score of 73. This was 37th percentile for age and sex matched control.  Had been seeing lipid clinic.  Repatha was started.  Zetia was discontinued due to excellent numbers.  He had a nuclear stress test done in the past, low risk, possible diaphragmatic attenuation. Exercise treadmill test in 2016 was reassuring.  Lipid profile from 11/01/2017- LDL-P-1559 high (would like <1000), LDL-C 98, HDL-C 52, triglycerides 192 total cholesterol 188 small LDL-P 1143 LDL size 20 small.  Lipoprotein score 67 insulin resistance elevated.  Prior CT scan showed aortic atherosclerosis.  Overall been doing very well. His last LDL was four. His Zetia was dropped. His Crestor was reduced to 10. Feels very well. No fevers chills nausea vomiting syncope bleeding.    Past Medical History:  Diagnosis Date  . Allergic rhinitis   . GERD (gastroesophageal reflux disease)   . Hyperlipidemia   . Squamous cell carcinoma     Past Surgical History:  Procedure Laterality Date  . skin cancer removals      Current Medications: Current Meds  Medication Sig  . aspirin 81 MG tablet Take 81 mg by mouth. Patient takes on Sat, Sun Tues, Thurs  . calcium carbonate (OS-CAL) 600 MG TABS tablet Take 600 mg by mouth 2 (two) times daily with a meal.  . Cholecalciferol (VITAMIN D) 2000 UNITS CAPS Take 2,000 Units by mouth daily.  . Evolocumab (REPATHA SURECLICK) 354 MG/ML SOAJ Inject 1 Syringe into  the skin every 14 (fourteen) days.  . fluticasone (FLONASE) 50 MCG/ACT nasal spray Place into both nostrils daily.  . Multiple Vitamin (MULTIVITAMIN) capsule Take 1 capsule by mouth daily.  . pantoprazole (PROTONIX) 40 MG tablet Take 40 mg by mouth. Patient takes Mon, Wed, Fri  . rosuvastatin (CRESTOR) 10 MG tablet Take 10 mg by mouth daily.     Allergies:   Lipitor [atorvastatin] and Tetracyclines & related   Social History   Socioeconomic History  . Marital status: Married    Spouse name: Not on file  . Number of children: Not on file  . Years of education: Not on file  . Highest education level: Not on file  Occupational History  . Not on file  Tobacco Use  . Smoking status: Never Smoker  . Smokeless tobacco: Never Used  Vaping Use  . Vaping Use: Never used  Substance and Sexual Activity  . Alcohol use: Never    Alcohol/week: 0.0 standard drinks  . Drug use: Never  . Sexual activity: Not on file  Other Topics Concern  . Not on file  Social History Narrative  . Not on file   Social Determinants of Health   Financial Resource Strain:   . Difficulty of Paying Living Expenses: Not on file  Food Insecurity:   . Worried About Charity fundraiser in the Last Year: Not on file  . Ran Out of Food in the Last Year: Not on file  Transportation Needs:   .  Lack of Transportation (Medical): Not on file  . Lack of Transportation (Non-Medical): Not on file  Physical Activity:   . Days of Exercise per Week: Not on file  . Minutes of Exercise per Session: Not on file  Stress:   . Feeling of Stress : Not on file  Social Connections:   . Frequency of Communication with Friends and Family: Not on file  . Frequency of Social Gatherings with Friends and Family: Not on file  . Attends Religious Services: Not on file  . Active Member of Clubs or Organizations: Not on file  . Attends Archivist Meetings: Not on file  . Marital Status: Not on file     Family History: The  patient's family history includes Atrial fibrillation in his mother; Diabetes in his father; Heart failure in his father; Leukemia in his mother; Prostate cancer in his father.  ROS:   Please see the history of present illness.     All other systems reviewed and are negative.  EKGs/Labs/Other Studies Reviewed:    The following studies were reviewed today:  ETT 02/13/15: There was no ST segment deviation noted during stress.  Arrhythmias during stress: none.  Arrhythmias during recovery: none.  There were no significant arrhythmias noted during the test.  ECG was interpretable and conclusive.  The patient had a normal exercise tolerance. There was no chest pain. There was an appropriate level of dyspnea. There were no arrhythmias, a normal heart rate response and normal BP response. There were no ischemic ST T wave changes and a normal heart rate recovery.  Negative adequate ETT  EKG:  EKG is  ordered today.  The ekg ordered today demonstrates sinus rhythm 62 no other abnormalities.  Recent Labs: 05/10/2019: ALT 28  Recent Lipid Panel    Component Value Date/Time   CHOL 79 (L) 05/10/2019 0848   TRIG 73 05/10/2019 0848   HDL 59 05/10/2019 0848   CHOLHDL 1.3 05/10/2019 0848   LDLCALC 4 05/10/2019 0848     Risk Assessment/Calculations:       Physical Exam:    VS:  BP 120/70   Pulse 62   Ht 5\' 10"  (1.778 m)   Wt 153 lb (69.4 kg)   SpO2 97%   BMI 21.95 kg/m     Wt Readings from Last 3 Encounters:  04/23/20 153 lb (69.4 kg)  04/25/18 151 lb (68.5 kg)  11/11/16 150 lb (68 kg)     GEN:  Well nourished, well developed in no acute distress HEENT: Normal NECK: No JVD; No carotid bruits LYMPHATICS: No lymphadenopathy CARDIAC: RRR, no murmurs, rubs, gallops RESPIRATORY:  Clear to auscultation without rales, wheezing or rhonchi  ABDOMEN: Soft, non-tender, non-distended MUSCULOSKELETAL:  No edema; No deformity  SKIN: Warm and dry NEUROLOGIC:  Alert and oriented x  3 PSYCHIATRIC:  Normal affect   ASSESSMENT:    1. Hyperlipidemia, unspecified hyperlipidemia type   2. Aortic atherosclerosis (HCC)   3. Atherosclerosis of native coronary artery of native heart without angina pectoris    PLAN:    In order of problems listed above:  Coronary atherosclerosis/aortic atherosclerosis -Calcium score from 2018 once again reviewed.  73.  No need to repeat calcium score. -Continue with aggressive secondary risk factor prevention -Obviously if chest discomfort or anginal symptoms were to occur, he needs to let us know.  Mixed hyperlipidemia -Repatha. --Crestor 10mg  --Dropped Zetia.   Doing very well. No changes made in medical management. Continue to monitor his lipids. I  am fine with him getting his lipids at Dr. Baldomero Lamy    Shared Decision Making/Informed Consent        Medication Adjustments/Labs and Tests Ordered: Current medicines are reviewed at length with the patient today.  Concerns regarding medicines are outlined above.  Orders Placed This Encounter  Procedures  . EKG 12-Lead   No orders of the defined types were placed in this encounter.   Patient Instructions  Medication Instructions:   Your physician recommends that you continue on your current medications as directed. Please refer to the Current Medication list given to you today.  *If you need a refill on your cardiac medications before your next appointment, please call your pharmacy*  Follow-Up: At [facility], you and your health needs are our priority.  As part of our continuing mission to provide you with exceptional heart care, we have created designated Provider Care Teams.  These Care Teams include your primary Cardiologist (physician) and Advanced Practice Providers (APPs -  Physician Assistants and Nurse Practitioners) who all work together to provide you with the care you need, when you need it.  We recommend signing up for the patient portal called "MyChart".   Sign up information is provided on this After Visit Summary.  MyChart is used to connect with patients for Virtual Visits (Telemedicine).  Patients are able to view lab/test results, encounter notes, upcoming appointments, etc.  Non-urgent messages can be sent to your provider as well.   To learn more about what you can do with MyChart, go to [email or web address].    Your next appointment:   1 year(s)  The format for your next appointment:   In Person  Provider:   Candee Furbish, MD        Signed, Candee Furbish, MD  04/23/2020 3:26 PM    [place]

## 2020-04-23 ENCOUNTER — Ambulatory Visit: Payer: Medicare Other | Admitting: Cardiology

## 2020-04-23 ENCOUNTER — Other Ambulatory Visit: Payer: Self-pay

## 2020-04-23 ENCOUNTER — Encounter: Payer: Self-pay | Admitting: Cardiology

## 2020-04-23 VITALS — BP 120/70 | HR 62 | Ht 70.0 in | Wt 153.0 lb

## 2020-04-23 DIAGNOSIS — I2510 Atherosclerotic heart disease of native coronary artery without angina pectoris: Secondary | ICD-10-CM | POA: Diagnosis not present

## 2020-04-23 DIAGNOSIS — I700 Atherosclerosis of aorta: Secondary | ICD-10-CM

## 2020-04-23 DIAGNOSIS — E785 Hyperlipidemia, unspecified: Secondary | ICD-10-CM

## 2020-04-23 NOTE — Patient Instructions (Signed)
Medication Instructions:  ? ?Your physician recommends that you continue on your current medications as directed. Please refer to the Current Medication list given to you today. ? ?*If you need a refill on your cardiac medications before your next appointment, please call your pharmacy* ? ? ?Follow-Up: ?At [facility], you and your health needs are our priority.  As part of our continuing mission to provide you with exceptional heart care, we have created designated Provider Care Teams.  These Care Teams include your primary Cardiologist (physician) and Advanced Practice Providers (APPs -  Physician Assistants and Nurse Practitioners) who all work together to provide you with the care you need, when you need it. ? ?We recommend signing up for the patient portal called "MyChart".  Sign up information is provided on this After Visit Summary.  MyChart is used to connect with patients for Virtual Visits (Telemedicine).  Patients are able to view lab/test results, encounter notes, upcoming appointments, etc.  Non-urgent messages can be sent to your provider as well.   ?To learn more about what you can do with MyChart, go to [email or web address].   ? ?Your next appointment:   ?1 year(s) ? ?The format for your next appointment:   ?In Person ? ?Provider:   ?Mark Skains, MD { ? ? ? ?

## 2020-05-12 ENCOUNTER — Encounter: Payer: Self-pay | Admitting: Radiation Oncology

## 2020-05-12 NOTE — Progress Notes (Addendum)
GU Location of Tumor / Histology: prostatic adenocarcinoma  If Prostate Cancer, Gleason Score is (3 + 3) and PSA is (2.33). Prostate volume: 43g  Biopsies of prostate (if applicable) revealed:   Past/Anticipated interventions by urology, if any: prostate biopsy, referral to Dr. Tammi Klippel  Patient wishes to discuss proton therapy, brachytherapy and any other suggestions Dr. Tammi Klippel has.  Past/Anticipated interventions by medical oncology, if any: no  Weight changes, if any: denies  Bowel/Bladder complaints, if any: IPSS 13. SHIM 15. Reports intermittent difficulty completely emptying his bladder. Denies dysuria. Reports a weak urine stream. Reports urinary urgency and frequency. Reports nocturia x 1. Explains he tried Flomax but didn't like the side effects. Denies hematuria. Reports occasional urinary leakage associated with urinary urgency. Denies any bowel complaints.   Nausea/Vomiting, if any: denies  Pain issues, if any:  denies  SAFETY ISSUES:  Prior radiation? denies  Pacemaker/ICD? denies  Possible current pregnancy? no, male patient  Is the patient on methotrexate? denies  Current Complaints / other details:  74 year old male. Resides in [place]. Married with 2 grown sons. Reports his father and older brother both had prostate cancer. Patient explains his older brother had a prostatectomy but unfortunately has passed away.

## 2020-05-13 ENCOUNTER — Ambulatory Visit
Admission: RE | Admit: 2020-05-13 | Discharge: 2020-05-13 | Disposition: A | Discharge status: Home / Self Care | Payer: Medicare Other | Source: Ambulatory Visit | Attending: Radiation Oncology | Admitting: Radiation Oncology

## 2020-05-13 ENCOUNTER — Encounter: Payer: Self-pay | Admitting: Medical Oncology

## 2020-05-13 ENCOUNTER — Other Ambulatory Visit: Payer: Self-pay

## 2020-05-13 ENCOUNTER — Encounter: Payer: Self-pay | Admitting: Radiation Oncology

## 2020-05-13 VITALS — BP 123/61 | HR 67 | Temp 97.7°F | Resp 18 | Ht 70.0 in | Wt 153.8 lb

## 2020-05-13 DIAGNOSIS — C61 Malignant neoplasm of prostate: Secondary | ICD-10-CM | POA: Insufficient documentation

## 2020-05-13 HISTORY — DX: Malignant neoplasm of prostate: C61

## 2020-05-13 NOTE — Progress Notes (Signed)
Radiation Oncology         [id number]) [phone] ________________________________  Initial outpatient Consultation  Name: Alexander Reynolds MRN: [id number]  Date: 05/13/2020  DOB: March 09, 1946  CC:Cari Caraway, MD  Irine Seal, MD   REFERRING PHYSICIAN: Irine Seal, MD  DIAGNOSIS: 74 y.o. 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    ICD-10-CM   1. Malignant neoplasm of prostate ([place])  C61     HISTORY OF PRESENT ILLNESS: Alexander Reynolds is a 74 y.o. male with a diagnosis of prostate cancer. 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 He has also had a variable PSA, mainly in the 1-2 range. In 11/2018, he was noted to have right base firmness on DRE and a PSA of 2.17. He proceeded to biopsy in 01/2019, which showed only HGPIN and atypia.  His PSA increased to 3.19 in 06/2019 but then decreased to 2.84 in 10/2019 and 2.33 in 01/2020. He proceeded to prostate MRI on 02/20/2020 showing a small right posterolateral peripheral zone lesion in the mid gland approaching apex (PI-RADS 4) but no other lesions of moderate or higher suspicion. The prostate volume was estimated at 42.66 cc. The patient proceeded to MRI fusion biopsy on 03/25/2020.  The prostate volume measured 43 cc by ultrasound.  Out of 15 core biopsies, 4 were positive.  The maximum Gleason score was 3+3, and this was seen in all three ROI MRI lesion cores and the right base.  The patient reviewed the biopsy results with his urologist and he has kindly been referred today for discussion of potential radiation treatment options.   PREVIOUS RADIATION THERAPY: No  PAST MEDICAL HISTORY:  Past Medical History:  Diagnosis Date  . Allergic rhinitis   . GERD (gastroesophageal reflux disease)   . Hyperlipidemia   . Prostate cancer ([place])   . Squamous cell carcinoma       PAST SURGICAL HISTORY: Past Surgical History:  Procedure Laterality Date  . PROSTATE BIOPSY    .  skin cancer removals      FAMILY HISTORY:  Family History  Problem Relation Age of Onset  . Atrial fibrillation Mother   . Leukemia Mother   . Prostate cancer Father   . Heart failure Father   . Diabetes Father   . Prostate cancer Brother   . Lung cancer Sister   . Breast cancer Neg Hx   . Colon cancer Neg Hx   . Pancreatic cancer Neg Hx     SOCIAL HISTORY:  Social History   Socioeconomic History  . Marital status: Married    Spouse name: Not on file  . Number of children: 2  . Years of education: Not on file  . Highest education level: Not on file  Occupational History  . Not on file  Tobacco Use  . Smoking status: Never Smoker  . Smokeless tobacco: Never Used  Vaping Use  . Vaping Use: Never used  Substance and Sexual Activity  . Alcohol use: Never    Alcohol/week: 0.0 standard drinks  . Drug use: Never  . Sexual activity: Yes  Other Topics Concern  . Not on file  Social History Narrative  . Not on file   Social Determinants of Health   Financial Resource Strain: Not on file  Food Insecurity: Not on file  Transportation Needs: Not on file  Physical Activity: Not on file  Stress: Not on file  Social  Connections: Not on file  Intimate Partner Violence: Not on file    ALLERGIES: Lipitor [atorvastatin] and Tetracyclines & related  MEDICATIONS:  Current Outpatient Medications  Medication Sig Dispense Refill  . aspirin 81 MG tablet Take 81 mg by mouth. Patient takes on Sat, Sun Tues, Thurs    . calcium carbonate (OS-CAL) 600 MG TABS tablet Take 600 mg by mouth 2 (two) times daily with a meal.    . Cholecalciferol (VITAMIN D) 2000 UNITS CAPS Take 2,000 Units by mouth daily.    . Evolocumab (REPATHA SURECLICK) 403 MG/ML SOAJ Inject 1 Syringe into the skin every 14 (fourteen) days. 2 pen 11  . fluticasone (FLONASE) 50 MCG/ACT nasal spray Place into both nostrils daily.    . Multiple Vitamin (MULTIVITAMIN) capsule Take 1 capsule by mouth daily.    .  pantoprazole (PROTONIX) 40 MG tablet Take 40 mg by mouth. Patient takes Mon, Wed, Fri    . rosuvastatin (CRESTOR) 10 MG tablet Take 10 mg by mouth daily.    . tadalafil (CIALIS) 5 MG tablet Take 5 mg by mouth daily as needed for erectile dysfunction.     No current facility-administered medications for this encounter.    REVIEW OF SYSTEMS:  On review of systems, the patient reports that he is doing well overall. He denies any chest pain, shortness of breath, cough, fevers, chills, night sweats, unintended weight changes. He denies any bowel disturbances, and denies abdominal pain, nausea or vomiting. He denies any new musculoskeletal or joint aches or pains. His IPSS was 13, indicating moderate urinary symptoms. He reports intermittent difficulty emptying his bladder, weak urine stream, urinary frequency and urgency with associated occasional leakage, and nocturia x1. His SHIM was 15, indicating he has moderate erectile dysfunction. A complete review of systems is obtained and is otherwise negative.    PHYSICAL EXAM:  Wt Readings from Last 3 Encounters:  05/13/20 153 lb 12.8 oz (69.8 kg)  04/23/20 153 lb (69.4 kg)  04/25/18 151 lb (68.5 kg)   Temp Readings from Last 3 Encounters:  05/13/20 97.7 F (36.5 C)   BP Readings from Last 3 Encounters:  05/13/20 123/61  04/23/20 120/70  04/25/18 122/76   Pulse Readings from Last 3 Encounters:  05/13/20 67  04/23/20 62  04/25/18 73   Pain Assessment Pain Score: 0-No pain/10  In general this is a well appearing Caucasian male in no acute distress. He is alert and oriented x4 and appropriate throughout the examination. HEENT reveals that the patient is normocephalic, atraumatic. EOMs are intact. PERRLA. Skin is intact without any evidence of gross lesions. Cardiopulmonary assessment is negative for acute distress and he exhibits normal effort. The abdomen is soft, non tender, non distended. Lower extremities are negative for pretibial pitting  edema, deep calf tenderness, cyanosis or clubbing.   KPS = 100  100 - Normal; no complaints; no evidence of disease. 90   - Able to carry on normal activity; minor signs or symptoms of disease. 80   - Normal activity with effort; some signs or symptoms of disease. 40   - Cares for self; unable to carry on normal activity or to do active work. 60   - Requires occasional assistance, but is able to care for most of his personal needs. 50   - Requires considerable assistance and frequent medical care. 57   - Disabled; requires special care and assistance. 62   - Severely disabled; hospital admission is indicated although death not imminent. 20   -  Very sick; hospital admission necessary; active supportive treatment necessary. 10   - Moribund; fatal processes progressing rapidly. 0     - Dead  Karnofsky DA, Abelmann [place], Craver LS and Burchenal JH [phone]) The use of the nitrogen mustards in the palliative treatment of carcinoma: with particular reference to bronchogenic carcinoma Cancer 1 [id number]  LABORATORY DATA:  No results found for: WBC, HGB, HCT, MCV, PLT No results found for: NA, K, CL, CO2 Lab Results  Component Value Date   ALT 28 05/10/2019   AST 29 05/10/2019   ALKPHOS 64 05/10/2019   BILITOT 0.6 05/10/2019     RADIOGRAPHY: No results found.    IMPRESSION/PLAN: 1. 74 y.o. gentleman with Stage T2a adenocarcinoma of the prostate with Gleason Score of 3+3, and PSA of 2.33. We discussed the patient's workup and outlined the nature of prostate cancer in this setting. The patient's T stage, Gleason's score, and PSA put him into the low risk group. Accordingly, he is eligible for a variety of potential treatment options including active surveillance, brachytherapy, 5.5 weeks of external radiation, or prostatectomy. We discussed the available radiation techniques, and focused on the details and logistics of delivery. We discussed and outlined the risks, benefits, short and long-term  effects associated with radiotherapy and compared and contrasted these with prostatectomy. We discussed the role of SpaceOAR gel in reducing the rectal toxicity associated with radiotherapy.  He appears to have a good understanding of his disease and our treatment recommendations which are of curative intent.  He was encouraged to ask questions that were answered to his stated satisfaction.  At the conclusion of our conversation, the patient remains undecided. He understands that the NCCN guideline recommendation is to proceed in active surveillance but he is somewhat anxious about this approach and therefore would like to investigate treatment options further, including proton therapy.  He appears to be leaning towards proceeding with brachytherapy. For now, he will remain on active surveillance. We will share our discussion with Dr. Jeffie Pollock and look forward to following his progress. He knows that he is welcome to call at any time with further questions or concerns and that we are more than happy to conitnue to participate in his care whenever he decides to pursue treatment.    Nicholos Johns, PA-C    Tyler Pita, MD  [facility] Direct Dial: [phone]  Fax: [phone] [email or web address]  Skype  [email or web address]   This document serves as a record of services personally performed by Tyler Pita, MD and Freeman Caldron, PA-C. It was created on their behalf by Wilburn Mylar, a trained medical scribe. The creation of this record is based on the scribe's personal observations and the provider's statements to them. This document has been checked and approved by the attending provider.

## 2020-05-13 NOTE — Progress Notes (Signed)
Introduced myself to patient as the prostate nurse navigator and discussed my role. Patient has strong family history of prostate cancer including his father and brother. He has two sons and we discussed possible genetic testing and early screening for his sons. He is undecided at this time regarding treatment. He would like to discuss brachytherapy and he is also very interested in proton therapy. No barriers to care are identified at this time. I gave him my business card and asked him to reach out to me with questions or concerns. He voiced understanding.

## 2020-06-11 DIAGNOSIS — N3289 Other specified disorders of bladder: Secondary | ICD-10-CM | POA: Diagnosis not present

## 2020-06-11 DIAGNOSIS — Z01812 Encounter for preprocedural laboratory examination: Secondary | ICD-10-CM | POA: Diagnosis not present

## 2020-06-11 DIAGNOSIS — R9349 Abnormal radiologic findings on diagnostic imaging of other urinary organs: Secondary | ICD-10-CM | POA: Diagnosis not present

## 2020-06-24 ENCOUNTER — Encounter: Payer: Self-pay | Admitting: Dermatology

## 2020-06-24 ENCOUNTER — Other Ambulatory Visit: Payer: Self-pay

## 2020-06-24 ENCOUNTER — Ambulatory Visit (INDEPENDENT_AMBULATORY_CARE_PROVIDER_SITE_OTHER): Payer: Medicare Other | Admitting: Dermatology

## 2020-06-24 DIAGNOSIS — C44319 Basal cell carcinoma of skin of other parts of face: Secondary | ICD-10-CM | POA: Diagnosis not present

## 2020-06-24 DIAGNOSIS — C4442 Squamous cell carcinoma of skin of scalp and neck: Secondary | ICD-10-CM

## 2020-06-24 DIAGNOSIS — C4491 Basal cell carcinoma of skin, unspecified: Secondary | ICD-10-CM

## 2020-06-24 DIAGNOSIS — C4492 Squamous cell carcinoma of skin, unspecified: Secondary | ICD-10-CM

## 2020-06-24 NOTE — Patient Instructions (Signed)

## 2020-06-25 ENCOUNTER — Encounter: Payer: Self-pay | Admitting: Dermatology

## 2020-06-25 NOTE — Progress Notes (Signed)
   Follow-Up Visit   Subjective  Alexander Reynolds is a 75 y.o. male who presents for the following: Procedure (BCC 2 SCC X1  PATH IN THE ROOM WITH PATIENT).  3 biopsy-proven skin cancers Location:  Duration:  Quality:  Associated Signs/Symptoms: Modifying Factors:  Severity:  Timing: Context: Patient expressed a preference to have the [place] above the left outer eye treated by Mohs surgery.  This will be arranged.  Objective  Well appearing patient in no apparent distress; mood and affect are within normal limits. Objective  Left Forehead: Biopsy site identified by patient, nurse, and me.  Objective  Left Temple Inferior: Biopsy site identified by patient, nurse, and me.   A focused examination was performed including Head and neck.. Relevant physical exam findings are noted in the Assessment and Plan.   Assessment & Plan    Basal cell carcinoma (BCC), unspecified site Left Forehead  Destruction of lesion Complexity: simple   Destruction method: electrodesiccation and curettage   Informed consent: discussed and consent obtained   Timeout:  patient name, date of birth, surgical site, and procedure verified Anesthesia: the lesion was anesthetized in a standard fashion   Anesthetic:  1% lidocaine w/ epinephrine 1-100,000 local infiltration Curettage performed in three different directions: Yes   Curettage cycles:  3 Lesion length (cm):  1.2 Lesion width (cm):  1.2 Margin per side (cm):  0 Final wound size (cm):  1.2 Hemostasis achieved with:  ferric subsulfate Outcome: patient tolerated procedure well with no complications   Additional details:  Wound innoculated with 5 fluorouracil solution.  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  Destruction of lesion Complexity: simple   Destruction method: electrodesiccation and curettage   Informed consent: discussed and consent obtained   Timeout:  patient name, date of birth, surgical site, and procedure  verified Anesthesia: the lesion was anesthetized in a standard fashion   Anesthetic:  1% lidocaine w/ epinephrine 1-100,000 local infiltration Curettage performed in three different directions: Yes   Curettage cycles:  3 Lesion length (cm):  1.4 Lesion width (cm):  1.4 Margin per side (cm):  0 Final wound size (cm):  1.4 Hemostasis achieved with:  ferric subsulfate Outcome: patient tolerated procedure well with no complications   Additional details:  Wound innoculated with 5 fluorouracil solution.     I, [name], MD, have reviewed all documentation for this visit.  The documentation on 06/25/20 for the exam, diagnosis, procedures, and orders are all accurate and complete.

## 2020-08-02 NOTE — Telephone Encounter (Signed)
Error

## 2020-08-06 DIAGNOSIS — D3131 Benign neoplasm of right choroid: Secondary | ICD-10-CM | POA: Diagnosis not present

## 2020-08-06 DIAGNOSIS — H35372 Puckering of macula, left eye: Secondary | ICD-10-CM | POA: Diagnosis not present

## 2020-08-06 DIAGNOSIS — Z961 Presence of intraocular lens: Secondary | ICD-10-CM | POA: Diagnosis not present

## 2020-08-06 DIAGNOSIS — H52203 Unspecified astigmatism, bilateral: Secondary | ICD-10-CM | POA: Diagnosis not present

## 2020-10-15 ENCOUNTER — Encounter: Payer: Self-pay | Admitting: Orthopaedic Surgery

## 2020-10-15 ENCOUNTER — Ambulatory Visit: Payer: Medicare Other | Admitting: Orthopaedic Surgery

## 2020-10-15 ENCOUNTER — Other Ambulatory Visit: Payer: Self-pay

## 2020-10-15 DIAGNOSIS — M79651 Pain in right thigh: Secondary | ICD-10-CM

## 2020-10-15 DIAGNOSIS — M79659 Pain in unspecified thigh: Secondary | ICD-10-CM | POA: Insufficient documentation

## 2020-10-15 NOTE — Progress Notes (Signed)
Office Visit Note   Patient: Alexander Reynolds           Date of Birth: Nov 29, 1945           MRN: [id number] Visit Date: 10/15/2020              Requested by: Cari Caraway, [place],  [place] [address] PCP: Cari Caraway, MD   Assessment & Plan: Visit Diagnoses:  1. Pain of right thigh     Plan: Mr. Gibler sustained injury to his right mid anterior thigh while hiking 6 days ago.  He slipped in his left leg and tried to brace with his right leg and felt some discomfort.  After several days he developed some ecchymosis.  He has been able to walk and sleep fairly well but with certain activities he will have some sharp pain.  By exam he has had a partial tear of the mid quadriceps muscle there is very minimal induration and no gap.  He has full quick extension of his knee without knee effusion.  Neurologically intact.  I think this represents a muscle pull and probably will resolve over period of 4 or so weeks.  He can try Voltaren gel and even a thigh support if needed he remains quite active.  I think is fine continuing his activities but limiting his activities those that are not uncomfortable until it heals  Follow-Up Instructions: Return if symptoms worsen or fail to improve.   Orders:  No orders of the defined types were placed in this encounter.  No orders of the defined types were placed in this encounter.     Procedures: No procedures performed   Clinical Data: No additional findings.   Subjective: Chief Complaint  Patient presents with  . Right Leg - Pain  Patient presents today for right thigh pain. He said that he was hiking 6days ago and strained his thigh muscle. He states that it is tender to touch and painful anteriorly. He does okay with normal walking. Certain movements like lifting his leg causes pain. He is taking NSAIDs for pain. He is leaving for [place] in 3 weeks and wants to feel better.   HPI  Review of  Systems   Objective: Vital Signs: Ht 5\' 10"  (1.778 m)   Wt 153 lb (69.4 kg)   BMI 21.95 kg/m   Physical Exam Constitutional:      Appearance: He is well-developed.  Eyes:     Pupils: Pupils are equal, round, and reactive to light.  Pulmonary:     Effort: Pulmonary effort is normal.  Skin:    General: Skin is warm and dry.  Neurological:     Mental Status: He is alert and oriented to person, place, and time.  Psychiatric:        Behavior: Behavior normal.     Ortho Exam right thigh with tenderness mid anteriorly in the quadriceps muscle.  No gap.  There is some ecchymosis along the medial thigh but no pain.  No knee effusion.  Full quick extension of his right knee without effusion.  No pain about the distal quadriceps or the patella tendon.  No distal edema.  Neurologically intact.  Straight leg raise negative.  Painless range of motion of both hips. Specialty Comments:  No specialty comments available.  Imaging: No results found.   PMFS History: Patient Active Problem List   Diagnosis Date Noted  . Thigh pain 10/15/2020  . Malignant neoplasm of prostate ([place])  05/13/2020  . Appendicitis, acute 08/28/2018  . Family history of coronary artery disease 01/22/2015  . Essential hypertension 01/22/2015  . Hyperlipidemia 01/22/2015   Past Medical History:  Diagnosis Date  . Allergic rhinitis   . Basal cell carcinoma 03/12/2020   sup- left forehead (CX35FU)  . Basal cell carcinoma 03/12/2020   left temple superior([facility])  . GERD (gastroesophageal reflux disease)   . Hyperlipidemia   . Prostate cancer ([place])   . Squamous cell carcinoma 03/12/2020   in situ-left temple inferior (CX35FU)    Family History  Problem Relation Age of Onset  . Atrial fibrillation Mother   . Leukemia Mother   . Prostate cancer Father   . Heart failure Father   . Diabetes Father   . Prostate cancer Brother   . Lung cancer Sister   . Breast cancer Neg Hx   . Colon cancer Neg Hx   .  Pancreatic cancer Neg Hx     Past Surgical History:  Procedure Laterality Date  . PROSTATE BIOPSY    . skin cancer removals     Social History   Occupational History  . Not on file  Tobacco Use  . Smoking status: Never Smoker  . Smokeless tobacco: Never Used  Vaping Use  . Vaping Use: Never used  Substance and Sexual Activity  . Alcohol use: Never    Alcohol/week: 0.0 standard drinks  . Drug use: Never  . Sexual activity: Yes

## 2020-10-28 ENCOUNTER — Other Ambulatory Visit: Payer: Self-pay | Admitting: Cardiology

## 2020-11-05 DIAGNOSIS — R3912 Poor urinary stream: Secondary | ICD-10-CM | POA: Diagnosis not present

## 2020-11-24 ENCOUNTER — Ambulatory Visit: Payer: Medicare Other | Admitting: Dermatology

## 2020-11-24 ENCOUNTER — Other Ambulatory Visit: Payer: Self-pay

## 2020-11-24 ENCOUNTER — Other Ambulatory Visit: Payer: Self-pay | Admitting: Dermatology

## 2020-11-24 ENCOUNTER — Encounter: Payer: Self-pay | Admitting: Dermatology

## 2020-11-24 DIAGNOSIS — Z85828 Personal history of other malignant neoplasm of skin: Secondary | ICD-10-CM | POA: Diagnosis not present

## 2020-11-24 DIAGNOSIS — G479 Sleep disorder, unspecified: Secondary | ICD-10-CM | POA: Diagnosis not present

## 2020-11-24 DIAGNOSIS — Z1283 Encounter for screening for malignant neoplasm of skin: Secondary | ICD-10-CM

## 2020-11-24 DIAGNOSIS — R413 Other amnesia: Secondary | ICD-10-CM | POA: Diagnosis not present

## 2020-11-24 DIAGNOSIS — R945 Abnormal results of liver function studies: Secondary | ICD-10-CM | POA: Diagnosis not present

## 2020-11-24 DIAGNOSIS — R7309 Other abnormal glucose: Secondary | ICD-10-CM | POA: Diagnosis not present

## 2020-11-24 DIAGNOSIS — R7301 Impaired fasting glucose: Secondary | ICD-10-CM | POA: Diagnosis not present

## 2020-11-24 DIAGNOSIS — L820 Inflamed seborrheic keratosis: Secondary | ICD-10-CM | POA: Diagnosis not present

## 2020-11-24 DIAGNOSIS — D485 Neoplasm of uncertain behavior of skin: Secondary | ICD-10-CM | POA: Diagnosis not present

## 2020-11-24 DIAGNOSIS — L821 Other seborrheic keratosis: Secondary | ICD-10-CM | POA: Diagnosis not present

## 2020-11-24 DIAGNOSIS — E782 Mixed hyperlipidemia: Secondary | ICD-10-CM | POA: Diagnosis not present

## 2020-11-24 DIAGNOSIS — K219 Gastro-esophageal reflux disease without esophagitis: Secondary | ICD-10-CM | POA: Diagnosis not present

## 2020-11-24 DIAGNOSIS — Z79899 Other long term (current) drug therapy: Secondary | ICD-10-CM | POA: Diagnosis not present

## 2020-11-24 NOTE — Patient Instructions (Signed)

## 2020-11-24 NOTE — Progress Notes (Addendum)
Follow-Up Visit   Subjective  Alexander Reynolds is a 75 y.o. male who presents for the following: Follow-up (Mid chest, right brow new spots of concern patient has a list).  Multiple new crusts and gentleman with a history of dozens of nonmelanoma cancers. Location:  Duration:  Quality:  Associated Signs/Symptoms: Modifying Factors:  Severity:  Timing: Context:   Objective  Well appearing patient in no apparent distress; mood and affect are within normal limits. Mid Back, Right Upper Arm - Anterior Defer biopsy today due to travel     mid upper chest Inflamed 8 mm crust, SCCA versus I-SK     left upper chest Inflamed 8 mm crust, SCCA versus I SK     Left Flank Hornlike 17mm crust, SCCA versus I-S K     Right Eyebrow Waxy 9 mm crust, rule out carcinoma     Torso - Posterior (Back) In addition to the 4 spots biopsied, Alexander Reynolds has at least a half dozen other superficial carcinomas and many dozen thick actinic keratoses versus carcinomas.   All skin waist up examined.   Assessment & Plan    Seborrheic keratosis Head - Anterior (Face)  History of basal cell carcinoma (BCC) (2) Right Upper Arm - Anterior; Mid Back  Neoplasm of uncertain behavior of skin (4) mid upper chest  Skin / nail biopsy Type of biopsy: tangential   Informed consent: discussed and consent obtained   Timeout: patient name, date of birth, surgical site, and procedure verified   Anesthesia: the lesion was anesthetized in a standard fashion   Anesthetic:  1% lidocaine w/ epinephrine 1-100,000 local infiltration Instrument used: flexible razor blade   Hemostasis achieved with: aluminum chloride and electrodesiccation   Outcome: patient tolerated procedure well   Post-procedure details: wound care instructions given    Specimen 1 - Surgical pathology Differential Diagnosis: isk  Check Margins: No  left upper chest  Skin / nail biopsy Type of biopsy: tangential   Informed  consent: discussed and consent obtained   Timeout: patient name, date of birth, surgical site, and procedure verified   Anesthesia: the lesion was anesthetized in a standard fashion   Anesthetic:  1% lidocaine w/ epinephrine 1-100,000 local infiltration Instrument used: flexible razor blade   Hemostasis achieved with: aluminum chloride and electrodesiccation   Outcome: patient tolerated procedure well   Post-procedure details: wound care instructions given    Specimen 2 - Surgical pathology Differential Diagnosis: isk  Check Margins: No  Left Flank  Skin / nail biopsy Type of biopsy: tangential   Informed consent: discussed and consent obtained   Timeout: patient name, date of birth, surgical site, and procedure verified   Anesthesia: the lesion was anesthetized in a standard fashion   Anesthetic:  1% lidocaine w/ epinephrine 1-100,000 local infiltration Instrument used: flexible razor blade   Hemostasis achieved with: aluminum chloride and electrodesiccation   Outcome: patient tolerated procedure well   Post-procedure details: wound care instructions given    Specimen 3 - Surgical pathology Differential Diagnosis: isk  Check Margins: No  Right Eyebrow  Skin / nail biopsy Type of biopsy: tangential   Informed consent: discussed and consent obtained   Timeout: patient name, date of birth, surgical site, and procedure verified   Anesthesia: the lesion was anesthetized in a standard fashion   Anesthetic:  1% lidocaine w/ epinephrine 1-100,000 local infiltration Instrument used: flexible razor blade   Hemostasis achieved with: aluminum chloride and electrodesiccation   Outcome: patient tolerated procedure  well   Post-procedure details: wound care instructions given    Specimen 4 - Surgical pathology Differential Diagnosis: bcc scc  Check Margins: No  Encounter for screening for malignant neoplasm of skin Torso - Posterior (Back)  Will focus on these other spots once  today's lesions are taken care of.     I, [name], MD, have reviewed all documentation for this visit.  The documentation on 12/09/20 for the exam, diagnosis, procedures, and orders are all accurate and

## 2020-11-25 DIAGNOSIS — Z1389 Encounter for screening for other disorder: Secondary | ICD-10-CM | POA: Diagnosis not present

## 2020-11-25 DIAGNOSIS — K219 Gastro-esophageal reflux disease without esophagitis: Secondary | ICD-10-CM | POA: Diagnosis not present

## 2020-11-25 DIAGNOSIS — R7303 Prediabetes: Secondary | ICD-10-CM | POA: Diagnosis not present

## 2020-11-25 DIAGNOSIS — Z79899 Other long term (current) drug therapy: Secondary | ICD-10-CM | POA: Diagnosis not present

## 2020-11-25 DIAGNOSIS — D509 Iron deficiency anemia, unspecified: Secondary | ICD-10-CM | POA: Diagnosis not present

## 2020-11-25 DIAGNOSIS — J301 Allergic rhinitis due to pollen: Secondary | ICD-10-CM | POA: Diagnosis not present

## 2020-11-25 DIAGNOSIS — Z0000 Encounter for general adult medical examination without abnormal findings: Secondary | ICD-10-CM | POA: Diagnosis not present

## 2020-11-25 DIAGNOSIS — G479 Sleep disorder, unspecified: Secondary | ICD-10-CM | POA: Diagnosis not present

## 2020-11-25 DIAGNOSIS — E782 Mixed hyperlipidemia: Secondary | ICD-10-CM | POA: Diagnosis not present

## 2020-11-25 DIAGNOSIS — I10 Essential (primary) hypertension: Secondary | ICD-10-CM | POA: Diagnosis not present

## 2020-11-25 DIAGNOSIS — Z23 Encounter for immunization: Secondary | ICD-10-CM | POA: Diagnosis not present

## 2020-11-29 ENCOUNTER — Encounter: Payer: Self-pay | Admitting: Dermatology

## 2021-01-13 ENCOUNTER — Telehealth: Payer: Self-pay | Admitting: Dermatology

## 2021-01-13 NOTE — Telephone Encounter (Signed)
Patient left message on office voice mail saying that he was calling for pathology results from last visit with [name], M.D.

## 2021-01-14 NOTE — Telephone Encounter (Signed)
Path to patient  

## 2021-03-24 DIAGNOSIS — E611 Iron deficiency: Secondary | ICD-10-CM | POA: Diagnosis not present

## 2021-03-24 DIAGNOSIS — I10 Essential (primary) hypertension: Secondary | ICD-10-CM | POA: Diagnosis not present

## 2021-04-23 ENCOUNTER — Ambulatory Visit: Payer: Medicare Other | Admitting: Dermatology

## 2021-04-23 ENCOUNTER — Other Ambulatory Visit: Payer: Self-pay

## 2021-04-23 DIAGNOSIS — Z85828 Personal history of other malignant neoplasm of skin: Secondary | ICD-10-CM

## 2021-04-23 DIAGNOSIS — L570 Actinic keratosis: Secondary | ICD-10-CM

## 2021-04-23 DIAGNOSIS — L821 Other seborrheic keratosis: Secondary | ICD-10-CM | POA: Diagnosis not present

## 2021-04-23 DIAGNOSIS — Z1283 Encounter for screening for malignant neoplasm of skin: Secondary | ICD-10-CM

## 2021-05-20 DIAGNOSIS — R3912 Poor urinary stream: Secondary | ICD-10-CM | POA: Diagnosis not present

## 2021-05-20 DIAGNOSIS — R3915 Urgency of urination: Secondary | ICD-10-CM | POA: Diagnosis not present

## 2021-05-22 ENCOUNTER — Encounter: Payer: Self-pay | Admitting: Dermatology

## 2021-05-22 NOTE — Progress Notes (Signed)
° °  Follow-Up Visit   Subjective  Alexander Reynolds is a 75 y.o. male who presents for the following: Skin Problem (Here for lesion check. Some on the face, back of neck and ears. History of non mole skin cancers. ).  Several new spots to check including left temple, history of multiple skin cancers. Location:  Duration:  Quality:  Associated Signs/Symptoms: Modifying Factors:  Severity:  Timing: Context:   Objective  Well appearing patient in no apparent distress; mood and affect are within normal limits. Mid Back Stuck-on, waxy papules and plaques.   Mid Back Waist up skin exam.  No atypical pigmented lesions.  Multiple 4 to 12 mm pink crusts which are likely a mixture of actinic keratoses and superficial carcinomas.  Not are currently bothering patient and for now he chooses to watch these.  Left Temple Hornlike 5 mm pink crust, AK versus CIS     All skin waist up examined.   Assessment & Plan    Seborrheic keratosis Mid Back  Benign no treatment needed.  Screening for malignant neoplasm of skin Mid Back  Yearly skin exam.  AK (actinic keratosis) Left Temple  Return for biopsy/curettage if freezing fails  Destruction of lesion - Left Temple Complexity: simple   Destruction method: cryotherapy   Informed consent: discussed and consent obtained   Timeout:  patient name, date of birth, surgical site, and procedure verified Lesion destroyed using liquid nitrogen: Yes   Cryotherapy cycles:  1 Outcome: patient tolerated procedure well with no complications   Post-procedure details: wound care instructions given        I, [name], MD, have reviewed all documentation for this visit.  The documentation on 05/22/21 for the exam, diagnosis, procedures, and orders are all accurate and complete.

## 2021-05-26 DIAGNOSIS — H00014 Hordeolum externum left upper eyelid: Secondary | ICD-10-CM | POA: Diagnosis not present

## 2021-07-02 ENCOUNTER — Other Ambulatory Visit: Payer: Self-pay

## 2021-07-02 ENCOUNTER — Ambulatory Visit (INDEPENDENT_AMBULATORY_CARE_PROVIDER_SITE_OTHER): Payer: Medicare Other | Admitting: Dermatology

## 2021-07-02 DIAGNOSIS — L570 Actinic keratosis: Secondary | ICD-10-CM

## 2021-07-02 DIAGNOSIS — D485 Neoplasm of uncertain behavior of skin: Secondary | ICD-10-CM

## 2021-07-02 DIAGNOSIS — C4441 Basal cell carcinoma of skin of scalp and neck: Secondary | ICD-10-CM | POA: Diagnosis not present

## 2021-07-02 NOTE — Patient Instructions (Addendum)
Biopsy, Surgery (Curettage) & Surgery (Excision) Aftercare Instructions  1. Okay to remove bandage in 24 hours  2. Wash area with soap and water  3. Apply Vaseline to area twice daily until healed (Not Neosporin)  4. Okay to cover with a Band-Aid to decrease the chance of infection or prevent irritation from clothing; also it's okay to uncover lesion at home.  5. Suture instructions: return to our office in 7-10 or 10-14 days for a nurse visit for suture removal. Variable healing with sutures, if pain or itching occurs call our office. It's okay to shower or bathe 24 hours after sutures are given.  6. The following risks may occur after a biopsy, curettage or excision: bleeding, scarring, discoloration, recurrence, infection (redness, yellow drainage, pain or swelling).  7. For questions, concerns and results call our office at [place] before 4pm & Friday before 3pm. Biopsy results will be available in 1 week.    INFO ON PDT (PHOTODYNAMIC THERAPY)  Levulan/PDT Treatment Common Side Effects  - Burning/stinging, which may be severe and last up to 24-72 hours after your treatment  - Redness, swelling and/or peeling which may last up to 4 weeks  - Scaling/crusting which may last up to 4-6 weeks  - Sun sensitivity  Care Instructions  - Okay to wash with soap and water and shampoo as normal  - If needed, you can do a cold compress (ex. Ice packs) for comfort  - If okay with your Primary Doctor, you may use analgesics such as Tylenol every 4-6 hours, not to exceed recommended dose  - You may apply Cerave Healing Ointment, Vaseline or Aquaphor  - If you have a lot of swelling you may take a Benadryl to help with this  Sun Precautions  - Avoid all sun exposure for 2 days  - Wear a wide brim hat for the next week if outside  - Wear a sunblock with titanium dioxide greater than 5% daily   We will recheck you in 10-12 weeks. If any problems, please call the office and  ask to speak with a nurse.

## 2021-07-07 ENCOUNTER — Ambulatory Visit: Payer: Medicare Other | Admitting: Dermatology

## 2021-07-24 ENCOUNTER — Encounter: Payer: Self-pay | Admitting: Dermatology

## 2021-07-24 NOTE — Progress Notes (Signed)
° °  Follow-Up Visit   Subjective  Alexander Reynolds is a 76 y.o. male who presents for the following: Procedure (Pt here today for bx on the R Posterior Neck ).  Growth on back of neck plus crusts on face and scalp Location:  Duration:  Quality:  Associated Signs/Symptoms: Modifying Factors:  Severity:  Timing: Context:   Objective  Well appearing patient in no apparent distress; mood and affect are within normal limits. Right Anterior Neck, Right Parotid Area, Right Zygomatic Area, Scalp Hypertrophic lesions will be treated with liquid nitrogen freeze, schedule red light PDT for more diffuse involvement  Right Posterior Neck 1 cm eroded waxy pink crust, superficial BCC         A focused examination was performed including head and neck. Relevant physical exam findings are noted in the Assessment and Plan.   Assessment & Plan    Actinic keratosis (4) Right Zygomatic Area; Right Parotid Area; Right Anterior Neck; Scalp  Destruction of lesion - Right Anterior Neck, Right Parotid Area, Right Zygomatic Area Complexity: simple   Destruction method: cryotherapy   Informed consent: discussed and consent obtained   Timeout:  patient name, date of birth, surgical site, and procedure verified Lesion destroyed using liquid nitrogen: Yes   Cryotherapy cycles:  3 Outcome: patient tolerated procedure well with no complications   Post-procedure details: wound care instructions given    Basal cell carcinoma of skin of scalp and neck Right Posterior Neck  Skin / nail biopsy Type of biopsy: tangential   Informed consent: discussed and consent obtained   Timeout: patient name, date of birth, surgical site, and procedure verified   Anesthesia: the lesion was anesthetized in a standard fashion   Anesthetic:  1% lidocaine w/ epinephrine 1-100,000 local infiltration Instrument used: flexible razor blade   Hemostasis achieved with: aluminum chloride and electrodesiccation   Outcome:  patient tolerated procedure well   Post-procedure details: wound care instructions given    Destruction of lesion Complexity: simple   Destruction method: electrodesiccation and curettage   Informed consent: discussed and consent obtained   Timeout:  patient name, date of birth, surgical site, and procedure verified Anesthesia: the lesion was anesthetized in a standard fashion   Anesthetic:  1% lidocaine w/ epinephrine 1-100,000 local infiltration Curettage performed in three different directions: Yes   Electrodesiccation performed over the curetted area: Yes   Curettage cycles:  3 Lesion length (cm):  1.2 Lesion width (cm):  1.2 Margin per side (cm):  0 Final wound size (cm):  1.2 Hemostasis achieved with:  ferric subsulfate and electrodesiccation Outcome: patient tolerated procedure well with no complications   Post-procedure details: wound care instructions given   Additional details:  Wound inoculated with 5% fluorouracil solution  Specimen 1 - Surgical pathology Differential Diagnosis: R/O BCC VS SCC - TXPBX  Check Margins: No  After shave biopsy the base was treated with curettage plus cautery      I, Alexander Monarch, MD, have reviewed all documentation for this visit.  The documentation on 07/24/21 for the exam, diagnosis, procedures, and orders are all accurate and complete.

## 2021-08-17 DIAGNOSIS — Z961 Presence of intraocular lens: Secondary | ICD-10-CM | POA: Diagnosis not present

## 2021-08-17 DIAGNOSIS — H04123 Dry eye syndrome of bilateral lacrimal glands: Secondary | ICD-10-CM | POA: Diagnosis not present

## 2021-08-17 DIAGNOSIS — H5202 Hypermetropia, left eye: Secondary | ICD-10-CM | POA: Diagnosis not present

## 2021-09-24 DIAGNOSIS — L821 Other seborrheic keratosis: Secondary | ICD-10-CM | POA: Diagnosis not present

## 2021-09-24 DIAGNOSIS — C44519 Basal cell carcinoma of skin of other part of trunk: Secondary | ICD-10-CM | POA: Diagnosis not present

## 2021-09-24 DIAGNOSIS — Z85828 Personal history of other malignant neoplasm of skin: Secondary | ICD-10-CM | POA: Diagnosis not present

## 2021-09-24 DIAGNOSIS — C44712 Basal cell carcinoma of skin of right lower limb, including hip: Secondary | ICD-10-CM | POA: Diagnosis not present

## 2021-09-25 ENCOUNTER — Other Ambulatory Visit: Payer: Self-pay | Admitting: Cardiology

## 2021-10-08 ENCOUNTER — Ambulatory Visit: Payer: Medicare Other | Admitting: Cardiology

## 2021-10-08 ENCOUNTER — Encounter: Payer: Self-pay | Admitting: Cardiology

## 2021-10-08 DIAGNOSIS — E782 Mixed hyperlipidemia: Secondary | ICD-10-CM

## 2021-10-08 DIAGNOSIS — I2510 Atherosclerotic heart disease of native coronary artery without angina pectoris: Secondary | ICD-10-CM | POA: Diagnosis not present

## 2021-10-08 DIAGNOSIS — I2584 Coronary atherosclerosis due to calcified coronary lesion: Secondary | ICD-10-CM | POA: Diagnosis not present

## 2021-10-08 DIAGNOSIS — Z8249 Family history of ischemic heart disease and other diseases of the circulatory system: Secondary | ICD-10-CM

## 2021-10-08 NOTE — Progress Notes (Signed)
?Cardiology Office Note:   ? ?Date:  10/08/2021  ? ?ID:  Alexander Reynolds, DOB 1945-08-05, MRN [id number] ? ?PCP:  Cari Caraway, MD ?  ?[facility] Providers ?Cardiologist:  Candee Furbish, MD    ? ?Referring MD: Cari Caraway, MD  ? ? ?History of Present Illness:   ? ?Alexander Reynolds is a 76 y.o. male here for follow-up coronary artery disease.  Prior calcium noted in the proximal LAD and circumflex. ? ? ?Coronary calcium score in 05/18/2018:Coronary arteries: Calcium noted in proximal LAD and circumflex ?  ?IMPRESSION: ?Coronary calcium score of 73. This was 37th percentile for age and ?sex matched control. ?  ?Had been seeing lipid clinic.  Repatha was started.  Zetia was discontinued due to excellent numbers. ?  ?He had a nuclear stress test done in the past, low risk, possible diaphragmatic attenuation. Exercise treadmill test in 2016 was reassuring. ?  ?Lipid profile from 11/01/2017- LDL-P-1559 high (would like <1000), LDL-C 98, HDL-C 52, triglycerides 192 total cholesterol 188 small LDL-P 1143 LDL size 20 small.  Lipoprotein score 67 insulin resistance elevated. ?  ?Prior CT scan showed aortic atherosclerosis. ?  ?Overall been doing very well. His last LDL was four. His Zetia was dropped. His Crestor was reduced to 10. Feels very well. No fevers chills nausea vomiting syncope bleeding. ?Past Medical History:  ?Diagnosis Date  ? Allergic rhinitis   ? Basal cell carcinoma 03/12/2020  ? sup- left forehead (CX35FU)  ? Basal cell carcinoma 03/12/2020  ? left temple superior(MOHS)  ? GERD (gastroesophageal reflux disease)   ? Hyperlipidemia   ? Prostate cancer ([place])   ? 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 ? ?Past Surgical History:  ?Procedure Laterality Date  ? PROSTATE BIOPSY    ? skin cancer removals    ? ? ?Current Medications: ?No outpatient medications have been marked as taking for the 10/08/21 encounter (Office Visit) with Jerline Pain, MD.  ?  ? ?Allergies:   Lipitor  [atorvastatin] and Tetracyclines & related  ? ?Social History  ? ?Socioeconomic History  ? Marital status: Married  ?  Spouse name: Not on file  ? Number of children: 2  ? Years of education: Not on file  ? Highest education level: Not on file  ?Occupational History  ? Not on file  ?Tobacco Use  ? Smoking status: Never  ? Smokeless tobacco: Never  ?Vaping Use  ? Vaping Use: Never used  ?Substance and Sexual Activity  ? Alcohol use: Never  ?  Alcohol/week: 0.0 standard drinks  ? Drug use: Never  ? Sexual activity: Yes  ?Other Topics Concern  ? Not on file  ?Social History Narrative  ? Not on file  ? ?Social Determinants of Health  ? ?Financial Resource Strain: Not on file  ?Food Insecurity: Not on file  ?Transportation Needs: Not on file  ?Physical Activity: Not on file  ?Stress: Not on file  ?Social Connections: Not on file  ?  ? ?Family History: ?The patient's family history includes Atrial fibrillation in his mother; Diabetes in his father; Heart failure in his father; Leukemia in his mother; Lung cancer in his sister; Prostate cancer in his brother and father. There is no history of Breast cancer, Colon cancer, or Pancreatic cancer. ? ?ROS:   ?Please see the history of present illness.    ? All other systems reviewed and are negative. ? ?EKGs/Labs/Other Studies Reviewed:   ? ?The following studies  were reviewed today: ? ? ?EKG:  EKG is  ordered today.  The ekg ordered today demonstrates sinus rhythm 60 no other abnormalities ? ?Recent Labs: ?No results found for requested labs within last 8760 hours.  ?Recent Lipid Panel ?   ?Component Value Date/Time  ? CHOL 79 (L) 05/10/2019 0848  ? TRIG 73 05/10/2019 0848  ? HDL 59 05/10/2019 0848  ? CHOLHDL 1.3 05/10/2019 0848  ? [place] 4 05/10/2019 0848  ? ? ? ?Risk Assessment/Calculations:   ? ? ?    ? ?   ? ?Physical Exam:   ? ?VS:  BP 122/78   Pulse 60   Ht '5\' 10"'$  (1.778 m)   Wt 148 lb 9.6 oz (67.4 kg)   BMI 21.32 kg/m?    ? ?Wt Readings from Last 3 Encounters:   ?10/08/21 148 lb 9.6 oz (67.4 kg)  ?10/15/20 153 lb (69.4 kg)  ?05/13/20 153 lb 12.8 oz (69.8 kg)  ?  ? ?GEN:  Well nourished, well developed in no acute distress ?HEENT: Normal ?NECK: No JVD; No carotid bruits ?LYMPHATICS: No lymphadenopathy ?CARDIAC: RRR, no murmurs, no rubs, gallops ?RESPIRATORY:  Clear to auscultation without rales, wheezing or rhonchi  ?ABDOMEN: Soft, non-tender, non-distended ?MUSCULOSKELETAL:  No edema; No deformity  ?SKIN: Warm and dry ?NEUROLOGIC:  Alert and oriented x 3 ?PSYCHIATRIC:  Normal affect  ? ?ASSESSMENT:   ? ?1. Family history of coronary artery disease   ?2. Coronary artery calcification   ?3. Mixed hyperlipidemia   ? ?PLAN:   ? ?In order of problems listed above: ? ?Family history of coronary artery disease ?Father had CAD ? ?Coronary artery calcification ?Overall doing very well with goal-directed medical therapy.  No anginal symptoms.  He is on both Repatha as well as Crestor.  Excellent.  His LDL was 7.  No myalgias.  He is staying active, keeping up with the grandkids.  Enjoys biking, hiking. ? ?Hyperlipidemia ?Excellent response to Repatha.  Continue. ?  ? ? ? ? ?Medication Adjustments/Labs and Tests Ordered: ?Current medicines are reviewed at length with the patient today.  Concerns regarding medicines are outlined above.  ?Orders Placed This Encounter  ?Procedures  ? EKG 12-Lead  ? ?No orders of the defined types were placed in this encounter. ? ? ?Patient Instructions  ?Medication Instructions: The current medical regimen is effective;  continue present plan and medications. ? ?*If you need a refill on your cardiac medications before your next appointment, please call your pharmacy* ? ? ?Follow-Up: ?At [facility], you and your health needs are our priority.  As part of our continuing mission to provide you with exceptional heart care, we have created designated Provider Care Teams.  These Care Teams include your primary Cardiologist (physician) and Advanced  Practice Providers (APPs -  Physician Assistants and Nurse Practitioners) who all work together to provide you with the care you need, when you need it. ? ?We recommend signing up for the patient portal called "MyChart".  Sign up information is provided on this After Visit Summary.  MyChart is used to connect with patients for Virtual Visits (Telemedicine).  Patients are able to view lab/test results, encounter notes, upcoming appointments, etc.  Non-urgent messages can be sent to your provider as well.   ?To learn more about what you can do with MyChart, go to [email or web address].   ? ?Your next appointment:   ?1 year(s) ? ?The format for your next appointment:   ?In Person ? ?Provider:   ?Candee Furbish,  MD { ? ? ?Important Information About Sugar ? ? ? ? ?   ? ?Signed, ?Candee Furbish, MD  ?10/08/2021 11:12 AM    ?[place] ?

## 2021-10-08 NOTE — Assessment & Plan Note (Signed)
Father had CAD ?

## 2021-10-08 NOTE — Assessment & Plan Note (Signed)
Excellent response to Repatha.  Continue. ?

## 2021-10-08 NOTE — Assessment & Plan Note (Signed)
Overall doing very well with goal-directed medical therapy.  No anginal symptoms.  He is on both Repatha as well as Crestor.  Excellent.  His LDL was 7.  No myalgias.  He is staying active, keeping up with the grandkids.  Enjoys biking, hiking. ?

## 2021-10-08 NOTE — Patient Instructions (Signed)

## 2021-10-09 ENCOUNTER — Other Ambulatory Visit: Payer: Self-pay | Admitting: Urology

## 2021-10-09 DIAGNOSIS — C61 Malignant neoplasm of prostate: Secondary | ICD-10-CM

## 2021-10-29 DIAGNOSIS — C44319 Basal cell carcinoma of skin of other parts of face: Secondary | ICD-10-CM | POA: Diagnosis not present

## 2021-10-30 ENCOUNTER — Ambulatory Visit
Admission: RE | Admit: 2021-10-30 | Discharge: 2021-10-30 | Disposition: A | Discharge status: Home / Self Care | Payer: Medicare Other | Source: Ambulatory Visit | Attending: Urology | Admitting: Urology

## 2021-10-30 DIAGNOSIS — N3289 Other specified disorders of bladder: Secondary | ICD-10-CM | POA: Diagnosis not present

## 2021-10-30 DIAGNOSIS — K5730 Diverticulosis of large intestine without perforation or abscess without bleeding: Secondary | ICD-10-CM | POA: Diagnosis not present

## 2021-10-30 DIAGNOSIS — C61 Malignant neoplasm of prostate: Secondary | ICD-10-CM

## 2021-10-30 MED ORDER — GADOBENATE DIMEGLUMINE 529 MG/ML IV SOLN
13.0000 mL | Freq: Once | INTRAVENOUS | Status: AC | PRN
  Administered 2021-10-30: 13 mL via INTRAVENOUS

## 2021-11-18 DIAGNOSIS — R3915 Urgency of urination: Secondary | ICD-10-CM | POA: Diagnosis not present

## 2021-11-24 DIAGNOSIS — Z79899 Other long term (current) drug therapy: Secondary | ICD-10-CM | POA: Diagnosis not present

## 2021-11-24 DIAGNOSIS — R7309 Other abnormal glucose: Secondary | ICD-10-CM | POA: Diagnosis not present

## 2021-11-24 DIAGNOSIS — D509 Iron deficiency anemia, unspecified: Secondary | ICD-10-CM | POA: Diagnosis not present

## 2021-12-01 DIAGNOSIS — I700 Atherosclerosis of aorta: Secondary | ICD-10-CM | POA: Diagnosis not present

## 2021-12-01 DIAGNOSIS — E782 Mixed hyperlipidemia: Secondary | ICD-10-CM | POA: Diagnosis not present

## 2021-12-01 DIAGNOSIS — R790 Abnormal level of blood mineral: Secondary | ICD-10-CM | POA: Diagnosis not present

## 2021-12-01 DIAGNOSIS — R7309 Other abnormal glucose: Secondary | ICD-10-CM | POA: Diagnosis not present

## 2021-12-01 DIAGNOSIS — Z0000 Encounter for general adult medical examination without abnormal findings: Secondary | ICD-10-CM | POA: Diagnosis not present

## 2021-12-01 DIAGNOSIS — D509 Iron deficiency anemia, unspecified: Secondary | ICD-10-CM | POA: Diagnosis not present

## 2021-12-01 DIAGNOSIS — I10 Essential (primary) hypertension: Secondary | ICD-10-CM | POA: Diagnosis not present

## 2021-12-01 DIAGNOSIS — K219 Gastro-esophageal reflux disease without esophagitis: Secondary | ICD-10-CM | POA: Diagnosis not present

## 2021-12-01 DIAGNOSIS — Z23 Encounter for immunization: Secondary | ICD-10-CM | POA: Diagnosis not present

## 2021-12-14 ENCOUNTER — Other Ambulatory Visit: Payer: Self-pay | Admitting: Cardiology

## 2021-12-30 DIAGNOSIS — L821 Other seborrheic keratosis: Secondary | ICD-10-CM | POA: Diagnosis not present

## 2021-12-30 DIAGNOSIS — D485 Neoplasm of uncertain behavior of skin: Secondary | ICD-10-CM | POA: Diagnosis not present

## 2021-12-30 DIAGNOSIS — Z85828 Personal history of other malignant neoplasm of skin: Secondary | ICD-10-CM | POA: Diagnosis not present

## 2021-12-30 DIAGNOSIS — L570 Actinic keratosis: Secondary | ICD-10-CM | POA: Diagnosis not present

## 2022-05-03 DIAGNOSIS — Z85828 Personal history of other malignant neoplasm of skin: Secondary | ICD-10-CM | POA: Diagnosis not present

## 2022-05-03 DIAGNOSIS — C44519 Basal cell carcinoma of skin of other part of trunk: Secondary | ICD-10-CM | POA: Diagnosis not present

## 2022-05-03 DIAGNOSIS — L821 Other seborrheic keratosis: Secondary | ICD-10-CM | POA: Diagnosis not present

## 2022-05-03 DIAGNOSIS — C44319 Basal cell carcinoma of skin of other parts of face: Secondary | ICD-10-CM | POA: Diagnosis not present

## 2022-05-03 DIAGNOSIS — L570 Actinic keratosis: Secondary | ICD-10-CM | POA: Diagnosis not present

## 2022-05-03 DIAGNOSIS — C44712 Basal cell carcinoma of skin of right lower limb, including hip: Secondary | ICD-10-CM | POA: Diagnosis not present

## 2022-05-10 DIAGNOSIS — U071 COVID-19: Secondary | ICD-10-CM | POA: Diagnosis not present

## 2022-05-29 IMAGING — MR MR PROSTATE WO/W CM
12 series · 48 of 48 positions shown · IV contrast (multihance)
Comparison: None Available.

CLINICAL DATA: Prostate carcinoma, Gleason score 6. Active
surveillance.

EXAM:
MR PROSTATE WITHOUT AND WITH CONTRAST
TECHNIQUE: Multiplanar multisequence MRI images were obtained of the pelvis
centered about the prostate. Pre and post contrast images were
obtained.
CONTRAST:  13mL MULTIHANCE GADOBENATE DIMEGLUMINE 529 MG/ML IV SOLN

[Series 3: T2 · coronal · 3.0mm · 0.56mm/px · 1 of 23 slices shown (1 of 3)]
[im 1/23]
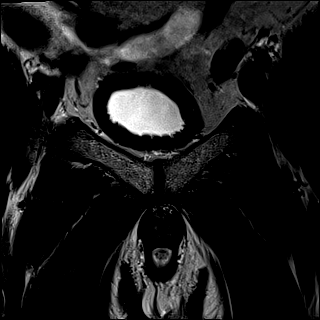

[Series 4: T1 · axial · 5.0mm · 1.25mm/px · 1 of 88 slices shown]
[im 1/88]
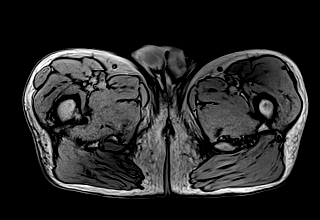

[Series 5: DWI · axial · 3.0mm · 1.75mm/px · z∈[+13,+85]mm · 2 of 75 slices shown (1 of 3)]
[im 1/75]
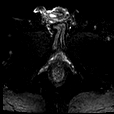
[im 75/75]
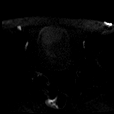

[Series 6: DWI · axial · 3.0mm · 1.75mm/px · 1 of 25 slices shown (2 of 3)]
[im 1/25]
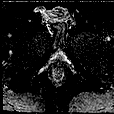

[Series 7: DWI · axial · 3.0mm · 1.75mm/px · 1 of 25 slices shown (3 of 3)]
[im 1/25]
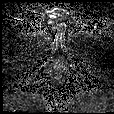

[Series 8: T2 · axial · 3.0mm · 0.56mm/px · 1 of 25 slices shown (2 of 3)]
[im 1/25]
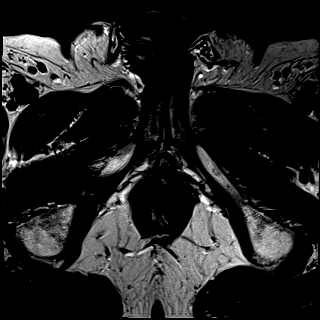

[Series 9: T2 · axial · 1.0mm · 1.04mm/px · z∈[+16,+87]mm · 2 of 72 slices shown (3 of 3)]
[im 1/72]
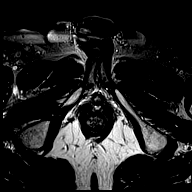
[im 72/72]
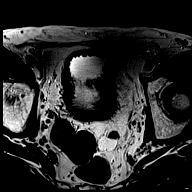

[Series 10: pre t1_twist_tra_dyn · axial · non-contrast · 3.5mm · 0.83mm/px · 1 of 22 slices shown]
[im 1/22]
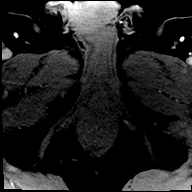

[Series 11: post t1_twist_tra_dyn-copy center · axial · non-contrast · 3.5mm · 0.83mm/px · z∈[+12,+86]mm · 17 of 660 slices shown]
[im 1/660]
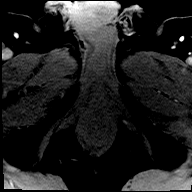
[im 42/660]
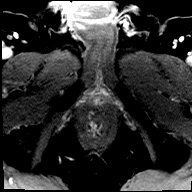
[im 83/660]
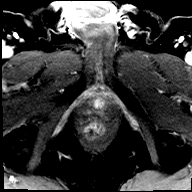
[im 124/660]
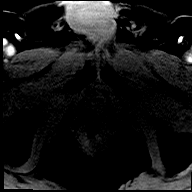
[im 165/660]
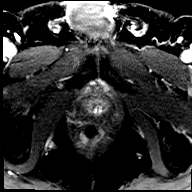
[im 206/660]
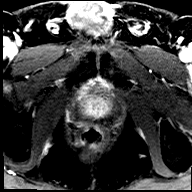
[im 248/660]
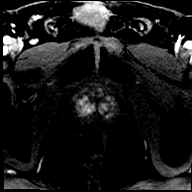
[im 289/660]
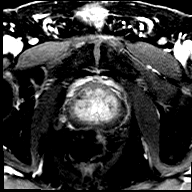
[im 330/660]
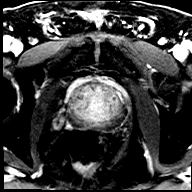
[im 371/660]
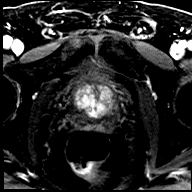
[im 412/660]
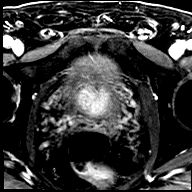
[im 454/660]
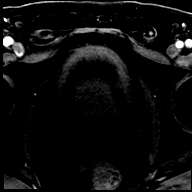
[im 495/660]
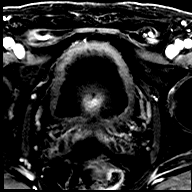
[im 536/660]
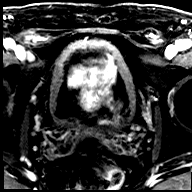
[im 577/660]
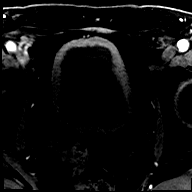
[im 618/660]
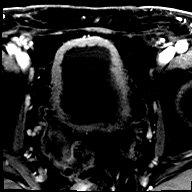
[im 660/660]
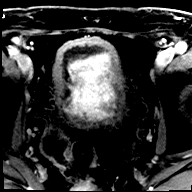

[Series 12: post t1_twist_tra_dyn-copy cent_sub · axial · 3.5mm · 0.83mm/px · z∈[+12,+86]mm · 17 of 638 slices shown]
[im 1/638]
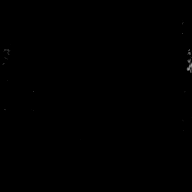
[im 40/638]
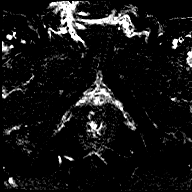
[im 80/638]
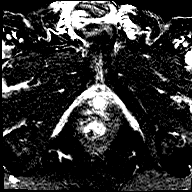
[im 120/638]
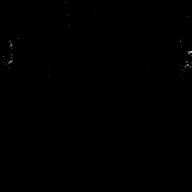
[im 160/638]
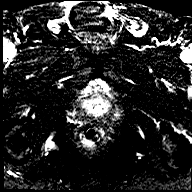
[im 200/638]
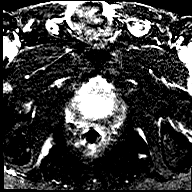
[im 239/638]
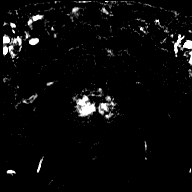
[im 279/638]
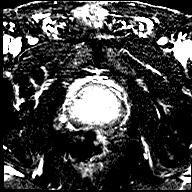
[im 319/638]
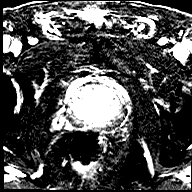
[im 359/638]
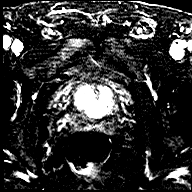
[im 399/638]
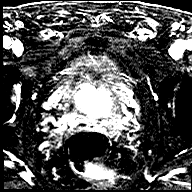
[im 438/638]
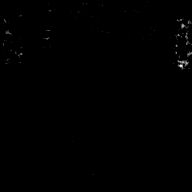
[im 478/638]
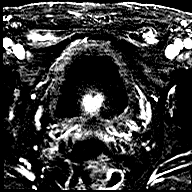
[im 518/638]
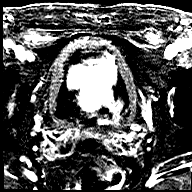
[im 558/638]
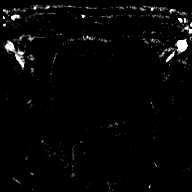
[im 598/638]
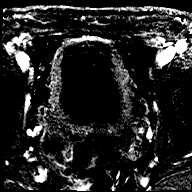
[im 638/638]
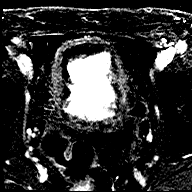

[Series 13: t1_vibe_dixon_tra_f · axial · 2.5mm · 0.91mm/px · z∈[-11,+186]mm · 2 of 80 slices shown]
[im 1/80]
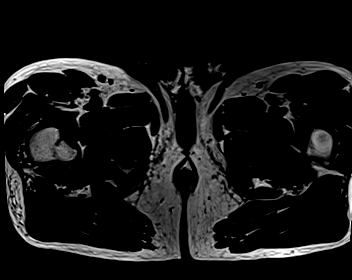
[im 80/80]
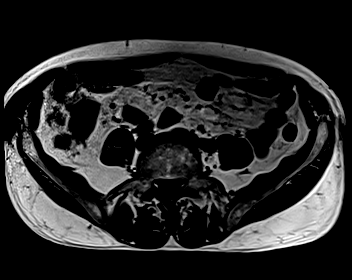

[Series 14: t1_vibe_dixon_tra_w · axial · 2.5mm · 0.91mm/px · z∈[-11,+186]mm · 2 of 80 slices shown]
[im 1/80]
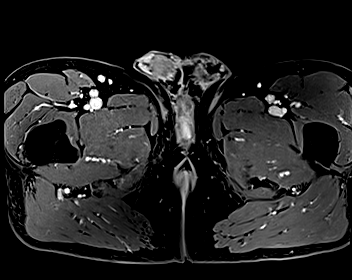
[im 80/80]
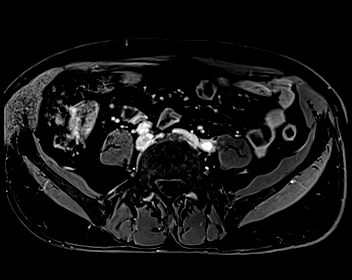

[48 of 48 positions shown; findings below may reference images not displayed]

FINDINGS: Prostate:

-- Peripheral Zone: A small T2 hypointense lesion is again seen in
the right posterolateral mid gland, measuring 7 x 5 mm on image
52/9. This shows no significant change in size since previous study.
This nodule shows marked ADC hypointensity, mild DWI hyperintensity,
and early focal contrast enhancement. PI-RADS 4. No other focal
peripheral zone lesions identified.

-- Transition/Central Zone: Mildly enlarged with multiple BPH
nodules. No nodules with suspicious characteristics on T2-weighted
imaging.

-- Measurements/Volume:  4.8 by 3.8 x 5.2 cm (volume = 50 cm^3)

Transcapsular spread:  Absent

Seminal vesicle involvement:  Absent

Neurovascular bundle involvement:  Absent

Pelvic adenopathy: None visualized

Bone metastasis: None visualized

Other: Diffuse bladder wall thickening, consistent with chronic
bladder outlet obstruction. Sigmoid diverticulosis, without evidence
of diverticulitis.
IMPRESSION: Stable 7 mm peripheral zone lesion in the right posterolateral mid
gland, suspicious for high-grade carcinoma. PI-RADS 4 (v2.1): High
(clinically significant cancer likely)

No evidence of extracapsular extension or pelvic metastatic disease.

(I have post-processed this exam in the DynaCAD application for
potential fusion-guided biopsy.)

## 2022-06-14 ENCOUNTER — Other Ambulatory Visit (HOSPITAL_COMMUNITY): Payer: Self-pay

## 2022-06-14 DIAGNOSIS — C44319 Basal cell carcinoma of skin of other parts of face: Secondary | ICD-10-CM | POA: Diagnosis not present

## 2022-08-02 DIAGNOSIS — R3912 Poor urinary stream: Secondary | ICD-10-CM | POA: Diagnosis not present

## 2022-08-03 DIAGNOSIS — D1801 Hemangioma of skin and subcutaneous tissue: Secondary | ICD-10-CM | POA: Diagnosis not present

## 2022-08-03 DIAGNOSIS — Z85828 Personal history of other malignant neoplasm of skin: Secondary | ICD-10-CM | POA: Diagnosis not present

## 2022-08-03 DIAGNOSIS — L821 Other seborrheic keratosis: Secondary | ICD-10-CM | POA: Diagnosis not present

## 2022-08-03 DIAGNOSIS — L570 Actinic keratosis: Secondary | ICD-10-CM | POA: Diagnosis not present

## 2022-08-09 ENCOUNTER — Other Ambulatory Visit: Payer: Self-pay | Admitting: Urology

## 2022-08-09 DIAGNOSIS — R972 Elevated prostate specific antigen [PSA]: Secondary | ICD-10-CM

## 2022-08-09 DIAGNOSIS — C61 Malignant neoplasm of prostate: Secondary | ICD-10-CM

## 2022-09-04 ENCOUNTER — Other Ambulatory Visit: Payer: Medicare Other

## 2022-09-06 ENCOUNTER — Ambulatory Visit
Admission: RE | Admit: 2022-09-06 | Discharge: 2022-09-06 | Disposition: A | Discharge status: Home / Self Care | Payer: Medicare Other | Source: Ambulatory Visit | Attending: Urology | Admitting: Urology

## 2022-09-06 DIAGNOSIS — C61 Malignant neoplasm of prostate: Secondary | ICD-10-CM

## 2022-09-06 DIAGNOSIS — R972 Elevated prostate specific antigen [PSA]: Secondary | ICD-10-CM

## 2022-09-06 MED ORDER — GADOPICLENOL 0.5 MMOL/ML IV SOLN
7.0000 mL | Freq: Once | INTRAVENOUS | Status: AC | PRN
  Administered 2022-09-06: 7 mL via INTRAVENOUS

## 2022-09-09 DIAGNOSIS — I700 Atherosclerosis of aorta: Secondary | ICD-10-CM | POA: Diagnosis not present

## 2022-09-09 DIAGNOSIS — R011 Cardiac murmur, unspecified: Secondary | ICD-10-CM | POA: Diagnosis not present

## 2022-09-09 DIAGNOSIS — D509 Iron deficiency anemia, unspecified: Secondary | ICD-10-CM | POA: Diagnosis not present

## 2022-09-09 DIAGNOSIS — R790 Abnormal level of blood mineral: Secondary | ICD-10-CM | POA: Diagnosis not present

## 2022-09-09 DIAGNOSIS — Z6821 Body mass index (BMI) 21.0-21.9, adult: Secondary | ICD-10-CM | POA: Diagnosis not present

## 2022-09-09 DIAGNOSIS — F4389 Other reactions to severe stress: Secondary | ICD-10-CM | POA: Diagnosis not present

## 2022-09-09 DIAGNOSIS — R5383 Other fatigue: Secondary | ICD-10-CM | POA: Diagnosis not present

## 2022-09-09 DIAGNOSIS — I10 Essential (primary) hypertension: Secondary | ICD-10-CM | POA: Diagnosis not present

## 2022-09-13 ENCOUNTER — Telehealth: Payer: Self-pay | Admitting: Cardiology

## 2022-09-13 NOTE — Telephone Encounter (Signed)
Patient stated his PCP recommended the get a 2-D Echo and wants orders placed for this test.

## 2022-09-13 NOTE — Telephone Encounter (Signed)
Called pt in regards to Echocardiogram requested by PCP.  Pt reports has a heart murmur, increased fatigue and low DBP 55-60. Reports for the past 6 months has had to take naps throughout the day d/t decreased stamina.  Denies sob and swelling.   Advised pt 1 yr f/u with MD is due in May.  Scheduled pt for 09/27/22 at 11:20 am.  Advised pt will send message to MD to address.

## 2022-09-22 ENCOUNTER — Encounter: Payer: Self-pay | Admitting: Cardiology

## 2022-09-22 DIAGNOSIS — R011 Cardiac murmur, unspecified: Secondary | ICD-10-CM

## 2022-09-24 NOTE — Telephone Encounter (Signed)
Let's go ahead and order ECHO - heart murmur Thanks Donato Schultz, MD    Order placed and pt to be contacted to be scheduled.

## 2022-09-27 ENCOUNTER — Ambulatory Visit: Payer: Medicare Other | Attending: Cardiology | Admitting: Cardiology

## 2022-09-27 ENCOUNTER — Encounter: Payer: Self-pay | Admitting: Cardiology

## 2022-09-27 VITALS — BP 116/67 | HR 64 | Ht 70.0 in | Wt 150.0 lb

## 2022-09-27 DIAGNOSIS — E782 Mixed hyperlipidemia: Secondary | ICD-10-CM | POA: Diagnosis not present

## 2022-09-27 DIAGNOSIS — Z8249 Family history of ischemic heart disease and other diseases of the circulatory system: Secondary | ICD-10-CM | POA: Diagnosis not present

## 2022-09-27 DIAGNOSIS — I2584 Coronary atherosclerosis due to calcified coronary lesion: Secondary | ICD-10-CM

## 2022-09-27 DIAGNOSIS — I2510 Atherosclerotic heart disease of native coronary artery without angina pectoris: Secondary | ICD-10-CM | POA: Diagnosis not present

## 2022-09-27 DIAGNOSIS — R011 Cardiac murmur, unspecified: Secondary | ICD-10-CM | POA: Diagnosis not present

## 2022-09-27 NOTE — Patient Instructions (Addendum)
Medication Instructions:  Your physician recommends that you continue on your current medications as directed. Please refer to the Current Medication list given to you today.  *If you need a refill on your cardiac medications before your next appointment, please call your pharmacy*  Lab Work: TODAY: BMET If you have labs (blood work) drawn today and your tests are completely normal, you will receive your results only by: MyChart Message (if you have MyChart) OR A paper copy in the mail If you have any lab test that is abnormal or we need to change your treatment, we will call you to review the results.  Testing/Procedures: Your physician has requested that you have cardiac CT. Cardiac computed tomography (CT) is a painless test that uses an x-ray machine to take clear, detailed pictures of your heart. Please follow instruction sheet as given.  Follow-Up: At [facility], you and your health needs are our priority.  As part of our continuing mission to provide you with exceptional heart care, we have created designated Provider Care Teams.  These Care Teams include your primary Cardiologist (physician) and Advanced Practice Providers (APPs -  Physician Assistants and Nurse Practitioners) who all work together to provide you with the care you need, when you need it.  Your next appointment:   1 year   Provider:   Donato Schultz, MD     Other Instructions    Your cardiac CT will be scheduled at one of the below locations:   [facility] [address] [place], [place] [address] [phone]  If scheduled at [facility], please arrive at the [facility] and Children's Entrance (Entrance C2) of [facility] 30 minutes prior to test start time. You can use the FREE valet parking offered at entrance C (encouraged to control the heart rate for the test)  Proceed to the [facility] Radiology Department (first floor) to check-in and test prep.  All radiology  patients and guests should use entrance C2 at [facility], accessed from [facility], even though the hospital's physical address listed is [address]     Please follow these instructions carefully (unless otherwise directed):  Hold all erectile dysfunction medications at least 3 days (72 hrs) prior to test. (Ie viagra, cialis, sildenafil, tadalafil, etc) We will administer nitroglycerin during this exam.   On the Night Before the Test: Be sure to Drink plenty of water. Do not consume any caffeinated/decaffeinated beverages or chocolate 12 hours prior to your test. Do not take any antihistamines 12 hours prior to your test.  On the Day of the Test: Drink plenty of water until 1 hour prior to the test. Do not eat any food 1 hour prior to test. You may take your regular medications prior to the test.   After the Test: Drink plenty of water. After receiving IV contrast, you may experience a mild flushed feeling. This is normal. On occasion, you may experience a mild rash up to 24 hours after the test. This is not dangerous. If this occurs, you can take Benadryl 25 mg and increase your fluid intake. If you experience trouble breathing, this can be serious. If it is severe call 911 IMMEDIATELY. If it is mild, please call our office. If you take any of these medications: Glipizide/Metformin, Avandament, Glucavance, please do not take 48 hours after completing test unless otherwise instructed.  We will call to schedule your test 2-4 weeks out understanding that some insurance companies will  need an authorization prior to the service being performed.   For non-scheduling related questions, please contact the cardiac imaging nurse navigator should you have any questions/concerns: Rockwell [place], Cardiac Imaging Nurse Navigator Larey Brick, Cardiac Imaging Nurse Navigator [facility] Direct Office Dial: [phone]   For scheduling  needs, including cancellations and rescheduling, please call [place], [phone].

## 2022-09-27 NOTE — Progress Notes (Signed)
Cardiology Office Note:    Date:  09/27/2022   ID:  Alexander Reynolds, DOB 05/04/1946, MRN [id number]  PCP:  Gweneth Dimitri, MD   [facility] Providers Cardiologist:  Donato Schultz, MD     Referring MD: Gweneth Dimitri, MD    History of Present Illness:    Alexander Reynolds is a 77 y.o. male here for follow-up coronary artery disease.  Prior calcium noted in the proximal LAD and circumflex.   Coronary calcium score in 05/18/2018:Coronary arteries: Calcium noted in proximal LAD and circumflex   IMPRESSION: Coronary calcium score of 73. This was 37th percentile for age and sex matched control.   Had been seeing lipid clinic.  Repatha was started.  Zetia was discontinued due to excellent numbers.   He had a nuclear stress test done in the past, low risk, possible diaphragmatic attenuation. Exercise treadmill test in 2016 was reassuring.   Lipid profile from 11/01/2017- LDL-P-1559 high (would like <1000), LDL-C 98, HDL-C 52, triglycerides 192 total cholesterol 188 small LDL-P 1143 LDL size 20 small.  Lipoprotein score 67 insulin resistance elevated.   Prior CT scan showed aortic atherosclerosis.   Iron slightly low.  Been experiencing more fatigue. TSH OK. Active but frustrated.  Dr. Corliss Blacker heard systolic heart murmur.   Went to [employer].  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  Past Medical History:  Diagnosis Date   Allergic rhinitis    Basal cell carcinoma 03/12/2020   sup- left forehead (CX35FU)   Basal cell carcinoma 03/12/2020   left temple superior(MOHS)   GERD (gastroesophageal reflux disease)    Hyperlipidemia    Prostate cancer    Squamous cell carcinoma 03/12/2020   in situ-left temple inferior (CX35FU)    Past Surgical History:  Procedure Laterality Date   PROSTATE BIOPSY     skin cancer removals      Current Medications: Current Meds  Medication Sig   aspirin 81 MG tablet Take 81 mg by mouth. Patient takes on Sat, Sun  Tues, Thurs   Calcium Carb-Cholecalciferol 600-10 MG-MCG TABS 1 tablet   calcium carbonate (OS-CAL) 600 MG TABS tablet Take 600 mg by mouth 2 (two) times daily with a meal.   Cholecalciferol (VITAMIN D) 2000 UNITS CAPS Take 2,000 Units by mouth daily.   Evolocumab (REPATHA SURECLICK) 140 MG/ML SOAJ INJECT ONE PEN INTO SKIN EVERY 14 DAYS.   fluticasone (FLONASE) 50 MCG/ACT nasal spray Place into both nostrils daily.   Iron-Vitamin C (VITRON-C) 65-125 MG TABS 1 tablet   LORazepam (ATIVAN) 0.5 MG tablet Take 0.25-0.5 mg by mouth 2 (two) times daily as needed.   losartan (COZAAR) 50 MG tablet Take 50 mg by mouth daily.   Multiple Vitamin (MULTIVITAMIN) capsule Take 1 capsule by mouth daily.   pantoprazole (PROTONIX) 40 MG tablet 1 tablet Orally 3 times per week   rosuvastatin (CRESTOR) 10 MG tablet Take 10 mg by mouth daily.   tadalafil (CIALIS) 5 MG tablet Take 5 mg by mouth daily as needed for erectile dysfunction.     Allergies:   Lipitor [atorvastatin] and Tetracyclines & related   Social History   Socioeconomic History   Marital status: Married    Spouse name: Not on file   Number of children: 2   Years of education: Not on file   Highest education level: Not on file  Occupational History   Not on file  Tobacco Use   Smoking status: Never   Smokeless tobacco: Never  Vaping Use   Vaping Use: Never used  Substance and Sexual Activity   Alcohol use: Never    Alcohol/week: 0.0 standard drinks of alcohol   Drug use: Never   Sexual activity: Yes  Other Topics Concern   Not on file  Social History Narrative   Not on file   Social Determinants of Health   Financial Resource Strain: Not on file  Food Insecurity: Not on file  Transportation Needs: Not on file  Physical Activity: Not on file  Stress: Not on file  Social Connections: Not on file     Family History: The patient's family history includes Atrial fibrillation in his mother; Diabetes in his father; Heart failure  in his father; Leukemia in his mother; Lung cancer in his sister; Prostate cancer in his brother and father. There is no history of Breast cancer, Colon cancer, or Pancreatic cancer.  ROS:   Please see the history of present illness.     All other systems reviewed and are negative.  EKGs/Labs/Other Studies Reviewed:    The following studies were reviewed today:  Cardiac Studies & Procedures     STRESS TESTS  EXERCISE TOLERANCE TEST (ETT) 02/14/2015  Narrative  There was no ST segment deviation noted during stress.      CT SCANS  CT CARDIAC SCORING (SELF PAY ONLY) 05/19/2018  Addendum 05/19/2018  2:14 PM ADDENDUM REPORT: 05/19/2018 14:11  CLINICAL DATA:  Risk stratification  EXAM: Coronary Calcium Score  TECHNIQUE: The patient was scanned on a Siemens Somatom 64 slice scanner. Axial non-contrast 3 mm slices were carried out through the heart. The data set was analyzed on a dedicated work station and scored using the Agatson method.  FINDINGS: Non-cardiac: See separate report from [facility].  Ascending aorta: Normal diameter 3.2 cm  Pericardium: Normal  Coronary arteries: Calcium noted in proximal LAD and circumflex  IMPRESSION: Coronary calcium score of 73. This was 68 th percentile for age and sex matched control.  Charlton Haws   Electronically Signed By: Charlton Haws M.D. On: 05/19/2018 14:11  Narrative EXAM: OVER-READ INTERPRETATION CT CHEST  The following report is an over-read performed by radiologist Dr. Hulan Saas of [facility] on 05/18/2018. This over-read does not include interpretation of cardiac or coronary anatomy or pathology. The coronary calcium score interpretation by the cardiologist is attached.  COMPARISON:  None.  FINDINGS: Vascular: Mild atherosclerosis involving the descending thoracic aorta. No evidence of aneurysm. Aortic annular calcification. Mild aortic valvular  calcification.  Mediastinum/Nodes: No pathologic lymphadenopathy within the visualized mediastinum. Visualized esophagus normal in appearance.  Lungs/Pleura: Visualized lung parenchyma clear. Central bronchi patent without significant bronchial wall thickening. No pleural effusions.  Upper Abdomen: Unremarkable for the unenhanced technique.  Musculoskeletal: Degenerative disc disease involving the visualized LOWER thoracic spine.  IMPRESSION: 1.  Aortic Atherosclerosis, mild.  (ICD10-170.0) 2. Mild aortic valvular calcification. 3. No significant extracardiac findings otherwise.  Electronically Signed: By: Hulan Saas M.D. On: 05/18/2018 10:55           EKG:  September 27, 2022 normal sinus rhythm vertical axis heart rate 64 bpm Prior sinus rhythm 60 no other abnormalities  Recent Labs: No results found for requested labs within last 365 days.  Recent Lipid Panel    Component Value Date/Time   CHOL 79 (L) 05/10/2019 0848   TRIG 73 05/10/2019 0848   HDL 59 05/10/2019 0848   CHOLHDL 1.3 05/10/2019 0848   LDLCALC 4 05/10/2019 0848     Risk  Assessment/Calculations:              Physical Exam:    VS:  BP 116/67   Pulse 64   Ht  (1.778 m)   Wt 150 lb (68 kg)   SpO2 99%   BMI 21.52 kg/m     Wt Readings from Last 3 Encounters:  09/27/22 150 lb (68 kg)  10/08/21 148 lb 9.6 oz (67.4 kg)  10/15/20 153 lb (69.4 kg)     GEN:  Well nourished, well developed in no acute distress HEENT: Normal NECK: No JVD; No carotid bruits LYMPHATICS: No lymphadenopathy CARDIAC: RRR, 2/6 SM, no rubs, gallops RESPIRATORY:  Clear to auscultation without rales, wheezing or rhonchi  ABDOMEN: Soft, non-tender, non-distended MUSCULOSKELETAL:  No edema; No deformity  SKIN: Warm and dry NEUROLOGIC:  Alert and oriented x 3 PSYCHIATRIC:  Normal affect   ASSESSMENT:    1. Coronary artery calcification   2. Murmur, cardiac   3. Family history of coronary artery disease    4. Mixed hyperlipidemia     PLAN:    In order of problems listed above:  Heart murmur We will go and check an echocardiogram.  Could be mitral regurgitation.  Family history of coronary artery disease Father had CAD, continue with aggressive risk factor modification  Coronary artery calcification/coronary artery disease  He has been experiencing some increasing fatigue recently.  With his underlying coronary artery disease, possible anginal equivalent, we will go ahead and proceed with coronary CT scan to ensure that his lumen of coronary arteries are patent. He is on both Repatha as well as Crestor.  Excellent.  His LDL was 23.  No myalgias.    Hyperlipidemia Excellent response to Repatha.  Continue.  LDL 23       Medication Adjustments/Labs and Tests Ordered: Current medicines are reviewed at length with the patient today.  Concerns regarding medicines are outlined above.  Orders Placed This Encounter  Procedures   CT CORONARY MORPH W/CTA COR W/SCORE W/CA W/CM &/OR WO/CM   Basic metabolic panel   EKG 12-Lead   No orders of the defined types were placed in this encounter.   Patient Instructions  Medication Instructions:  Your physician recommends that you continue on your current medications as directed. Please refer to the Current Medication list given to you today.  *If you need a refill on your cardiac medications before your next appointment, please call your pharmacy*  Lab Work: TODAY: BMET If you have labs (blood work) drawn today and your tests are completely normal, you will receive your results only by: MyChart Message (if you have MyChart) OR A paper copy in the mail If you have any lab test that is abnormal or we need to change your treatment, we will call you to review the results.  Testing/Procedures: Your physician has requested that you have cardiac CT. Cardiac computed tomography (CT) is a painless test that uses an x-ray machine to take clear,  detailed pictures of your heart. Please follow instruction sheet as given.  Follow-Up: At [facility], you and your health needs are our priority.  As part of our continuing mission to provide you with exceptional heart care, we have created designated Provider Care Teams.  These Care Teams include your primary Cardiologist (physician) and Advanced Practice Providers (APPs -  Physician Assistants and Nurse Practitioners) who all work together to provide you with the care you need, when you need it.  Your next appointment:   1  year   Provider:   Donato Schultz, MD     Other Instructions    Your cardiac CT will be scheduled at one of the below locations:   [facility] [address] [place], [place] [address] [phone]  If scheduled at [facility], please arrive at the [facility] Entrance (Entrance C2) of [facility] 30 minutes prior to test start time. You can use the FREE valet parking offered at entrance C (encouraged to control the heart rate for the test)  Proceed to the [facility] Radiology Department (first floor) to check-in and test prep.  All radiology patients and guests should use entrance C2 at [facility], accessed from [facility] even though the hospital's physical address listed is [address]     Please follow these instructions carefully (unless otherwise directed):  Hold all erectile dysfunction medications at least 3 days (72 hrs) prior to test. (Ie viagra, cialis, sildenafil, tadalafil, etc) We will administer nitroglycerin during this exam.   On the Night Before the Test: Be sure to Drink plenty of water. Do not consume any caffeinated/decaffeinated beverages or chocolate 12 hours prior to your test. Do not take any antihistamines 12 hours prior to your test.  On the Day of the Test: Drink plenty of water until 1 hour prior to the test. Do not eat any food 1 hour prior to  test. You may take your regular medications prior to the test.   After the Test: Drink plenty of water. After receiving IV contrast, you may experience a mild flushed feeling. This is normal. On occasion, you may experience a mild rash up to 24 hours after the test. This is not dangerous. If this occurs, you can take Benadryl 25 mg and increase your fluid intake. If you experience trouble breathing, this can be serious. If it is severe call 911 IMMEDIATELY. If it is mild, please call our office. If you take any of these medications: Glipizide/Metformin, Avandament, Glucavance, please do not take 48 hours after completing test unless otherwise instructed.  We will call to schedule your test 2-4 weeks out understanding that some insurance companies will need an authorization prior to the service being performed.   For non-scheduling related questions, please contact the cardiac imaging nurse navigator should you have any questions/concerns: Rockwell Alexandria, Cardiac Imaging Nurse Navigator Larey Brick, Cardiac Imaging Nurse Navigator [facility] Direct Office Dial: [phone]   For scheduling needs, including cancellations and rescheduling, please call [place], [phone].    Signed, Donato Schultz, MD  09/27/2022 12:18 PM    [facility]

## 2022-09-28 LAB — BASIC METABOLIC PANEL
BUN/Creatinine Ratio: 15 (ref 10–24)
BUN: 15 mg/dL (ref 8–27)
CO2: 24 mmol/L (ref 20–29)
Calcium: 9.9 mg/dL (ref 8.6–10.2)
Chloride: 103 mmol/L (ref 96–106)
Creatinine, Ser: 1.01 mg/dL (ref 0.76–1.27)
Glucose: 93 mg/dL (ref 70–99)
Potassium: 4.8 mmol/L (ref 3.5–5.2)
Sodium: 141 mmol/L (ref 134–144)
eGFR: 78 mL/min/{1.73_m2} (ref >59)

## 2022-10-05 ENCOUNTER — Ambulatory Visit (HOSPITAL_COMMUNITY): Payer: Medicare Other | Attending: Internal Medicine

## 2022-10-05 ENCOUNTER — Telehealth (HOSPITAL_COMMUNITY): Payer: Self-pay | Admitting: *Deleted

## 2022-10-05 DIAGNOSIS — R011 Cardiac murmur, unspecified: Secondary | ICD-10-CM

## 2022-10-05 LAB — ECHOCARDIOGRAM COMPLETE
AR max vel: 2.08 cm2
AV Area VTI: 1.81 cm2
AV Area mean vel: 1.75 cm2
AV Mean grad: 6.5 mmHg
AV Peak grad: 11.6 mmHg
Ao pk vel: 1.7 m/s
Area-P 1/2: 3.07 cm2
P 1/2 time: 739 msec
S' Lateral: 2.8 cm

## 2022-10-05 NOTE — Telephone Encounter (Signed)
Reaching out to patient to offer assistance regarding upcoming cardiac imaging study; pt verbalizes understanding of appt date/time, parking situation and where to check in, pre-test NPO status and verified current allergies; name and call back number provided for further questions should they arise  Tannen Vandezande RN Navigator Cardiac Imaging  Heart and Vascular [phone] office [phone] cell  Patient aware to arrive at 12pm. 

## 2022-10-06 ENCOUNTER — Ambulatory Visit (HOSPITAL_COMMUNITY)
Admission: RE | Admit: 2022-10-06 | Discharge: 2022-10-06 | Disposition: A | Discharge status: Home / Self Care | Payer: Medicare Other | Source: Ambulatory Visit | Attending: Cardiology | Admitting: Cardiology

## 2022-10-06 DIAGNOSIS — I2510 Atherosclerotic heart disease of native coronary artery without angina pectoris: Secondary | ICD-10-CM

## 2022-10-06 DIAGNOSIS — R011 Cardiac murmur, unspecified: Secondary | ICD-10-CM | POA: Diagnosis not present

## 2022-10-06 DIAGNOSIS — I2584 Coronary atherosclerosis due to calcified coronary lesion: Secondary | ICD-10-CM | POA: Insufficient documentation

## 2022-10-06 MED ORDER — NITROGLYCERIN 0.4 MG SL SUBL
0.8000 mg | SUBLINGUAL_TABLET | Freq: Once | SUBLINGUAL | Status: AC
  Administered 2022-10-06: 0.8 mg via SUBLINGUAL

## 2022-10-06 MED ORDER — IOHEXOL 350 MG/ML SOLN
100.0000 mL | Freq: Once | INTRAVENOUS | Status: AC | PRN
  Administered 2022-10-06: 100 mL via INTRAVENOUS

## 2022-10-06 MED ORDER — NITROGLYCERIN 0.4 MG SL SUBL
SUBLINGUAL_TABLET | SUBLINGUAL | Status: AC
  Filled 2022-10-06: qty 2

## 2022-10-07 DIAGNOSIS — H35372 Puckering of macula, left eye: Secondary | ICD-10-CM | POA: Diagnosis not present

## 2022-10-07 DIAGNOSIS — Z961 Presence of intraocular lens: Secondary | ICD-10-CM | POA: Diagnosis not present

## 2022-10-07 DIAGNOSIS — D3131 Benign neoplasm of right choroid: Secondary | ICD-10-CM | POA: Diagnosis not present

## 2022-10-14 ENCOUNTER — Telehealth: Payer: Self-pay | Admitting: *Deleted

## 2022-10-14 DIAGNOSIS — K769 Liver disease, unspecified: Secondary | ICD-10-CM

## 2022-10-14 NOTE — Telephone Encounter (Signed)
  Hypervascular lesion RIGHT lobe liver 14 mm diameter, incompletely  characterized, could represent an atypical hemangioma; if this is a  low risk patient with no primary malignancy, hepatic dysfunction or  other hepatic risk factors, no follow-up imaging recommended.    If patient is not low risk, then follow-up MR imaging with and  without contrast recommended.   Let's order MRI abdomen with and without contrast to further evaluate.  Please forward to Dr. Gweneth Dimitri his PCP.  Donato Schultz, MD    Pt is aware of the above results and that order will be placed.  He is aware he will be contacted to be scheduled.

## 2022-10-22 ENCOUNTER — Encounter: Payer: Self-pay | Admitting: Cardiology

## 2022-10-22 NOTE — Telephone Encounter (Signed)
error 

## 2022-11-03 ENCOUNTER — Ambulatory Visit (HOSPITAL_COMMUNITY)
Admission: RE | Admit: 2022-11-03 | Discharge: 2022-11-03 | Disposition: A | Discharge status: Home / Self Care | Payer: Medicare Other | Source: Ambulatory Visit | Attending: Cardiology | Admitting: Cardiology

## 2022-11-03 DIAGNOSIS — K769 Liver disease, unspecified: Secondary | ICD-10-CM | POA: Insufficient documentation

## 2022-11-03 DIAGNOSIS — D1803 Hemangioma of intra-abdominal structures: Secondary | ICD-10-CM | POA: Diagnosis not present

## 2022-11-03 DIAGNOSIS — K7689 Other specified diseases of liver: Secondary | ICD-10-CM | POA: Diagnosis not present

## 2022-11-03 MED ORDER — GADOBUTROL 1 MMOL/ML IV SOLN
7.0000 mL | Freq: Once | INTRAVENOUS | Status: AC | PRN
  Administered 2022-11-03: 7 mL via INTRAVENOUS

## 2022-11-16 ENCOUNTER — Other Ambulatory Visit: Payer: Self-pay | Admitting: Cardiology

## 2022-11-30 DIAGNOSIS — R69 Illness, unspecified: Secondary | ICD-10-CM | POA: Diagnosis not present

## 2022-12-01 DIAGNOSIS — R69 Illness, unspecified: Secondary | ICD-10-CM | POA: Diagnosis not present

## 2022-12-01 NOTE — Telephone Encounter (Signed)
Testing was completed 11/08/22.

## 2022-12-06 DIAGNOSIS — R7309 Other abnormal glucose: Secondary | ICD-10-CM | POA: Diagnosis not present

## 2022-12-06 DIAGNOSIS — I10 Essential (primary) hypertension: Secondary | ICD-10-CM | POA: Diagnosis not present

## 2022-12-06 DIAGNOSIS — R7303 Prediabetes: Secondary | ICD-10-CM | POA: Diagnosis not present

## 2022-12-06 DIAGNOSIS — E782 Mixed hyperlipidemia: Secondary | ICD-10-CM | POA: Diagnosis not present

## 2022-12-06 DIAGNOSIS — R790 Abnormal level of blood mineral: Secondary | ICD-10-CM | POA: Diagnosis not present

## 2022-12-06 DIAGNOSIS — Z79899 Other long term (current) drug therapy: Secondary | ICD-10-CM | POA: Diagnosis not present

## 2023-01-31 DIAGNOSIS — Z0000 Encounter for general adult medical examination without abnormal findings: Secondary | ICD-10-CM | POA: Diagnosis not present

## 2023-01-31 DIAGNOSIS — Z85828 Personal history of other malignant neoplasm of skin: Secondary | ICD-10-CM | POA: Diagnosis not present

## 2023-01-31 DIAGNOSIS — G479 Sleep disorder, unspecified: Secondary | ICD-10-CM | POA: Diagnosis not present

## 2023-01-31 DIAGNOSIS — C44622 Squamous cell carcinoma of skin of right upper limb, including shoulder: Secondary | ICD-10-CM | POA: Diagnosis not present

## 2023-01-31 DIAGNOSIS — L812 Freckles: Secondary | ICD-10-CM | POA: Diagnosis not present

## 2023-01-31 DIAGNOSIS — I10 Essential (primary) hypertension: Secondary | ICD-10-CM | POA: Diagnosis not present

## 2023-01-31 DIAGNOSIS — L570 Actinic keratosis: Secondary | ICD-10-CM | POA: Diagnosis not present

## 2023-01-31 DIAGNOSIS — D1801 Hemangioma of skin and subcutaneous tissue: Secondary | ICD-10-CM | POA: Diagnosis not present

## 2023-01-31 DIAGNOSIS — R7309 Other abnormal glucose: Secondary | ICD-10-CM | POA: Diagnosis not present

## 2023-01-31 DIAGNOSIS — L821 Other seborrheic keratosis: Secondary | ICD-10-CM | POA: Diagnosis not present

## 2023-01-31 DIAGNOSIS — H6991 Unspecified Eustachian tube disorder, right ear: Secondary | ICD-10-CM | POA: Diagnosis not present

## 2023-01-31 DIAGNOSIS — K219 Gastro-esophageal reflux disease without esophagitis: Secondary | ICD-10-CM | POA: Diagnosis not present

## 2023-01-31 DIAGNOSIS — I700 Atherosclerosis of aorta: Secondary | ICD-10-CM | POA: Diagnosis not present

## 2023-01-31 DIAGNOSIS — L820 Inflamed seborrheic keratosis: Secondary | ICD-10-CM | POA: Diagnosis not present

## 2023-02-02 DIAGNOSIS — R3912 Poor urinary stream: Secondary | ICD-10-CM | POA: Diagnosis not present

## 2023-03-24 DIAGNOSIS — H903 Sensorineural hearing loss, bilateral: Secondary | ICD-10-CM | POA: Diagnosis not present

## 2023-03-24 DIAGNOSIS — H938X3 Other specified disorders of ear, bilateral: Secondary | ICD-10-CM | POA: Diagnosis not present

## 2023-03-24 DIAGNOSIS — R2689 Other abnormalities of gait and mobility: Secondary | ICD-10-CM | POA: Diagnosis not present

## 2023-04-13 DIAGNOSIS — R0683 Snoring: Secondary | ICD-10-CM | POA: Diagnosis not present

## 2023-04-25 DIAGNOSIS — M25562 Pain in left knee: Secondary | ICD-10-CM | POA: Diagnosis not present

## 2023-04-27 DIAGNOSIS — R3915 Urgency of urination: Secondary | ICD-10-CM | POA: Diagnosis not present

## 2023-05-09 DIAGNOSIS — R2689 Other abnormalities of gait and mobility: Secondary | ICD-10-CM | POA: Diagnosis not present

## 2023-05-12 DIAGNOSIS — G4710 Hypersomnia, unspecified: Secondary | ICD-10-CM | POA: Diagnosis not present

## 2023-05-12 DIAGNOSIS — G478 Other sleep disorders: Secondary | ICD-10-CM | POA: Diagnosis not present

## 2023-05-16 DIAGNOSIS — M25562 Pain in left knee: Secondary | ICD-10-CM | POA: Diagnosis not present

## 2023-05-26 DIAGNOSIS — S83242A Other tear of medial meniscus, current injury, left knee, initial encounter: Secondary | ICD-10-CM | POA: Diagnosis not present

## 2023-06-14 DIAGNOSIS — M948X6 Other specified disorders of cartilage, lower leg: Secondary | ICD-10-CM | POA: Diagnosis not present

## 2023-06-14 DIAGNOSIS — M94262 Chondromalacia, left knee: Secondary | ICD-10-CM | POA: Diagnosis not present

## 2023-06-14 DIAGNOSIS — M238X2 Other internal derangements of left knee: Secondary | ICD-10-CM | POA: Diagnosis not present

## 2023-06-14 DIAGNOSIS — M23322 Other meniscus derangements, posterior horn of medial meniscus, left knee: Secondary | ICD-10-CM | POA: Diagnosis not present

## 2023-06-14 DIAGNOSIS — G8918 Other acute postprocedural pain: Secondary | ICD-10-CM | POA: Diagnosis not present

## 2023-07-07 ENCOUNTER — Encounter: Payer: Self-pay | Admitting: Neurology

## 2023-07-07 ENCOUNTER — Ambulatory Visit: Payer: Medicare Other | Admitting: Neurology

## 2023-07-07 VITALS — BP 131/76 | HR 68 | Ht 70.0 in | Wt 154.0 lb

## 2023-07-07 DIAGNOSIS — R269 Unspecified abnormalities of gait and mobility: Secondary | ICD-10-CM | POA: Diagnosis not present

## 2023-07-07 NOTE — Progress Notes (Signed)
Chief Complaint  Patient presents with   New Patient (Initial Visit)    Pt in 15, here alone Pt is referred by Aquilla Hacker for imbalance with ambulation. Pt states he has had a work up with ENT and all was normal. Pt states he feels unsteady when walking, states has not had any falls.       ASSESSMENT AND PLAN  Alexander Reynolds is a 78 y.o. male   Slow onset gait abnormality,  Examination showed mild by lateral shoulder abduction weakness, mild bilateral hip flexion weakness, hyperreflexia, bilateral Hoffmann Babinski signs,  Most worrisome for cervical spondylitic myelopathy  MRI of cervical spine  Follow-up plan depend on MRI findings  DIAGNOSTIC DATA (LABS, IMAGING, TESTING) - I reviewed patient records, labs, notes, testing and imaging myself where available.   MEDICAL HISTORY:  Alexander Reynolds is a 78 year old male, seen in request by his primary care from [facility] Dr. Corliss Blacker, Toniann Fail, for evaluation of gait abnormality, initial evaluation July 07, 2023  History is obtained from the patient and review of electronic medical records. I personally reviewed pertinent available imaging films in PACS.   PMHx of  HLD HTN Prostate Cancer,   He was physically active, playing golf regularly, around summer 2024, he noticed mild unsteadiness, only noticed that when he ambulate, not steady, veer towards the left or right, also have urinary urgency frequency, he contributed to his prostate problem, he denies significant low back pain, no neck pain, no upper or lower extremity paresthesia  He was seen by ENT at [facility] in October 2024, no significant abnormality found PHYSICAL EXAM:   Vitals:   07/07/23 0913  BP: 131/76  Pulse: 68  Weight: 154 lb (69.9 kg)  Height: 5\' 10"  (1.778 m)     Body mass index is 22.1 kg/m.  PHYSICAL EXAMNIATION:  Gen: NAD, conversant, well nourised, well groomed                     Cardiovascular: Regular rate rhythm, no peripheral edema,  warm, nontender. Eyes: Conjunctivae clear without exudates or hemorrhage Neck: Supple, no carotid bruits. Pulmonary: Clear to auscultation bilaterally   NEUROLOGICAL EXAM:  MENTAL STATUS: Speech/cognition: Awake, alert, oriented to history taking and casual conversation CRANIAL NERVES: CN II: Visual fields are full to confrontation. Pupils are round equal and briskly reactive to light. CN III, IV, VI: extraocular movement are normal. No ptosis. CN V: Facial sensation is intact to light touch CN VII: Face is symmetric with normal eye closure  CN VIII: Hearing is normal to causal conversation. CN IX, X: Phonation is normal. CN XI: Head turning and shoulder shrug are intact  MOTOR: Mild bilateral shoulder abduction, mild bilateral hip flexion weakness  REFLEXES: Reflexes are 3 and symmetric at the biceps, triceps, knees, and ankles. Plantar responses are extensor bilaterally, also has bilateral Hoffmann signs,  SENSORY: Intact to light touch, pinprick and vibratory sensation are intact in fingers and toes.  COORDINATION: There is no trunk or limb dysmetria noted.  GAIT/STANCE: Able to get up from seated position, mildly retropulse instability, wide-based, stiff cautious gait,  REVIEW OF SYSTEMS:  Full 14 system review of systems performed and notable only for as above All other review of systems were negative.   ALLERGIES: Allergies  Allergen Reactions   Lipitor [Atorvastatin] Other (See Comments)    REACTION: Sternum aching   Tetracyclines & Related Rash    HOME MEDICATIONS: Current Outpatient Medications  Medication Sig Dispense Refill  aspirin 81 MG tablet Take 81 mg by mouth. Patient takes on Sat, Sun Tues, Thurs     Calcium Carb-Cholecalciferol 600-10 MG-MCG TABS 1 tablet     calcium carbonate (OS-CAL) 600 MG TABS tablet Take 600 mg by mouth 2 (two) times daily with a meal.     Cholecalciferol (VITAMIN D) 2000 UNITS CAPS Take 2,000 Units by mouth daily.      fluticasone (FLONASE) 50 MCG/ACT nasal spray Place into both nostrils daily.     Iron-Vitamin C (VITRON-C) 65-125 MG TABS 1 tablet     LORazepam (ATIVAN) 0.5 MG tablet Take 0.25-0.5 mg by mouth 2 (two) times daily as needed.     losartan (COZAAR) 25 MG tablet Take 25 mg by mouth daily.     Multiple Vitamin (MULTIVITAMIN) capsule Take 1 capsule by mouth daily.     pantoprazole (PROTONIX) 40 MG tablet 1 tablet Orally 3 times per week     REPATHA SURECLICK 140 MG/ML SOAJ INJECT ONE PEN INTO SKIN EVERY 14 DAYS. 6 mL 3   rosuvastatin (CRESTOR) 10 MG tablet Take 10 mg by mouth daily.     tadalafil (CIALIS) 5 MG tablet Take 5 mg by mouth daily as needed for erectile dysfunction.     No current facility-administered medications for this visit.    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-left temple inferior (CX35FU)    PAST SURGICAL HISTORY: Past Surgical History:  Procedure Laterality Date   PROSTATE BIOPSY     skin cancer removals      FAMILY HISTORY: Family History  Problem Relation Age of Onset   Atrial fibrillation Mother    Leukemia Mother    Prostate cancer Father    Heart failure Father    Diabetes Father    Prostate cancer Brother    Lung cancer Sister    Breast cancer Neg Hx    Colon cancer Neg Hx    Pancreatic cancer Neg Hx     SOCIAL HISTORY: Social History   Socioeconomic History   Marital status: Married    Spouse name: Not on file   Number of children: 2   Years of education: Not on file   Highest education level: Not on file  Occupational History   Not on file  Tobacco Use   Smoking status: Never   Smokeless tobacco: Never  Vaping Use   Vaping status:  Never Used  Substance and Sexual Activity   Alcohol use: Yes    Alcohol/week: 3.0 standard drinks of alcohol    Types: 1 Glasses of wine, 2 Cans of beer per week    Comment: drinks in a week   Drug use: Never   Sexual activity: Yes  Other Topics Concern   Not on file  Social History Narrative   Not on file   Social Drivers of Health   Financial Resource Strain: Not on file  Food Insecurity: Low Risk  (03/24/2023)   Received from [facility]   Hunger Vital Sign    Worried About Running Out of Food in the Last Year: Never true    Ran Out of Food in the Last Year: Never true  Transportation Needs: No Transportation Needs (03/24/2023)   Received from [employer]    In the past 12 months, has lack of reliable transportation kept you from medical appointments, meetings, work or from getting things needed for daily living? : No  Physical Activity: Not on file  Stress: Not on file  Social Connections: Not on file  Intimate Partner Violence: Not on file      Levert Feinstein, M.D. Ph.D.  [facility] [address], [address] [place], [place] [address] Ph: [phone] Fax: [phone]  CC:  Aquilla Hacker, PA-C [address] [place],  [place] [address]  Gweneth Dimitri, MD

## 2023-07-12 ENCOUNTER — Telehealth: Payer: Self-pay | Admitting: Neurology

## 2023-07-12 NOTE — Telephone Encounter (Signed)
 [employer] medicare NPR sent to GI [phone]

## 2023-08-08 DIAGNOSIS — L570 Actinic keratosis: Secondary | ICD-10-CM | POA: Diagnosis not present

## 2023-08-08 DIAGNOSIS — C44712 Basal cell carcinoma of skin of right lower limb, including hip: Secondary | ICD-10-CM | POA: Diagnosis not present

## 2023-08-08 DIAGNOSIS — L812 Freckles: Secondary | ICD-10-CM | POA: Diagnosis not present

## 2023-08-08 DIAGNOSIS — C44519 Basal cell carcinoma of skin of other part of trunk: Secondary | ICD-10-CM | POA: Diagnosis not present

## 2023-08-08 DIAGNOSIS — C44219 Basal cell carcinoma of skin of left ear and external auricular canal: Secondary | ICD-10-CM | POA: Diagnosis not present

## 2023-08-08 DIAGNOSIS — L821 Other seborrheic keratosis: Secondary | ICD-10-CM | POA: Diagnosis not present

## 2023-08-08 DIAGNOSIS — Z85828 Personal history of other malignant neoplasm of skin: Secondary | ICD-10-CM | POA: Diagnosis not present

## 2023-08-08 DIAGNOSIS — L820 Inflamed seborrheic keratosis: Secondary | ICD-10-CM | POA: Diagnosis not present

## 2023-08-10 ENCOUNTER — Ambulatory Visit
Admission: RE | Admit: 2023-08-10 | Discharge: 2023-08-10 | Disposition: A | Discharge status: Home / Self Care | Payer: Medicare Other | Source: Ambulatory Visit | Attending: Neurology | Admitting: Neurology

## 2023-08-10 DIAGNOSIS — R269 Unspecified abnormalities of gait and mobility: Secondary | ICD-10-CM

## 2023-08-10 DIAGNOSIS — E785 Hyperlipidemia, unspecified: Secondary | ICD-10-CM | POA: Diagnosis not present

## 2023-08-10 DIAGNOSIS — I10 Essential (primary) hypertension: Secondary | ICD-10-CM | POA: Diagnosis not present

## 2023-08-12 ENCOUNTER — Telehealth: Payer: Self-pay | Admitting: Neurology

## 2023-08-12 DIAGNOSIS — R269 Unspecified abnormalities of gait and mobility: Secondary | ICD-10-CM

## 2023-08-12 NOTE — Telephone Encounter (Signed)
 Please call patient again  I failed to reach patient by calling his and his wife's number listed, left message for them to call back  MRI of cervical spine showed degenerative changes, most noticeable at C4-5, C5-6, but there was no evidence of spinal cord compression to explain his slow worsening gait abnormality  I have ordered MRI of the brain and EMG nerve conduction study for further evaluation.   MRI cervical spine without contrast demonstrating: - At C4-5 disc bulging and facet hypertrophy with severe right foraminal stenosis. - At C6-7 broad disc bulging with moderate left foraminal stenosis  Orders Placed This Encounter  Procedures   MR BRAIN WO CONTRAST   NCV with EMG(electromyography)

## 2023-08-15 NOTE — Telephone Encounter (Signed)
 Called and lvm 1st attempt

## 2023-08-15 NOTE — Telephone Encounter (Signed)
 Called and spoke to pt and re;ayed results/recommendations. Pt voiced understanding.

## 2023-08-24 DIAGNOSIS — R3915 Urgency of urination: Secondary | ICD-10-CM | POA: Diagnosis not present

## 2023-09-08 ENCOUNTER — Other Ambulatory Visit

## 2023-09-12 ENCOUNTER — Ambulatory Visit
Admission: RE | Admit: 2023-09-12 | Discharge: 2023-09-12 | Disposition: A | Discharge status: Home / Self Care | Source: Ambulatory Visit | Attending: Neurology | Admitting: Neurology

## 2023-09-12 DIAGNOSIS — R269 Unspecified abnormalities of gait and mobility: Secondary | ICD-10-CM

## 2023-09-13 ENCOUNTER — Encounter: Payer: Self-pay | Admitting: Neurology

## 2023-09-16 ENCOUNTER — Encounter: Payer: Self-pay | Admitting: Neurology

## 2023-09-16 ENCOUNTER — Ambulatory Visit: Admitting: Neurology

## 2023-09-16 VITALS — BP 138/74 | Resp 16 | Ht 70.0 in | Wt 154.0 lb

## 2023-09-16 DIAGNOSIS — R269 Unspecified abnormalities of gait and mobility: Secondary | ICD-10-CM

## 2023-09-16 DIAGNOSIS — R531 Weakness: Secondary | ICD-10-CM | POA: Diagnosis not present

## 2023-09-16 NOTE — Progress Notes (Signed)
 Chief Complaint  Patient presents with   NERVE CONDUCTION STUDY     Emgrm4, alone,       ASSESSMENT AND PLAN  Alexander Reynolds is a 78 y.o. male   Slow onset gait abnormality,  Brisk reflex on examinations,  MRI of cervical spine showed multilevel degenerative changes, with severe right foraminal stenosis at C4-5, moderate C6-7, no cord compression  MRI of the brain showed mild small vessel disease  EMG nerve conduction study April 2025 was essentially normal, no evidence of large fiber peripheral neuropathy, right cervical radiculopathy, or intrinsic muscle disease  Is overall functioning well, encouraged him to continue moderate exercise, only return to clinic for worsening symptoms   DIAGNOSTIC DATA (LABS, IMAGING, TESTING) - I reviewed patient records, labs, notes, testing and imaging myself where available.   MEDICAL HISTORY:  Alexander Reynolds is a 78 year old male, seen in request by his primary care from [facility] Dr. Corliss Blacker, Toniann Fail, for evaluation of gait abnormality, initial evaluation July 07, 2023  History is obtained from the patient and review of electronic medical records. I personally reviewed pertinent available imaging films in PACS.   PMHx of  HLD HTN Prostate Cancer,   He was physically active, playing golf regularly, around summer 2024, he noticed mild unsteadiness, only noticed that when he ambulate, not steady, veer towards the left or right, also have urinary urgency frequency, he contributed to his prostate problem, he denies significant low back pain, no neck pain, no upper or lower extremity paresthesia  He was seen by ENT at [facility] in October 2024, no significant abnormality found  UPDATE September 16 2023: He remains physically active, able to play golf without much difficulty, do still feel unsteadiness, he had a left knee arthroscopic surgery in January 2025, which has improved his left knee pain, but continue has mild limited range of motion  We  personally reviewed MRI of the brain without contrast April/2025, mild small vessel disease MRI of cervical spine, mild degenerative changes, most noticeable C4-5 with severe right foraminal stenosis, C6-7 with moderate left foraminal stenosis  PHYSICAL EXAM:   Vitals:   09/16/23 0927  BP: 138/74  Resp: 16  SpO2: 96%  Weight: 154 lb (69.9 kg)  Height: 5\' 10"  (1.778 m)     Body mass index is 22.1 kg/m.  PHYSICAL EXAMNIATION:  Gen: NAD, conversant, well nourised, well groomed                     Cardiovascular: Regular rate rhythm, no peripheral edema, warm, nontender. Eyes: Conjunctivae clear without exudates or hemorrhage Neck: Supple, no carotid bruits. Pulmonary: Clear to auscultation bilaterally   NEUROLOGICAL EXAM:  MENTAL STATUS: Speech/cognition: Awake, alert, oriented to history taking and casual conversation CRANIAL NERVES: CN II: Visual fields are full to confrontation. Pupils are round equal and briskly reactive to light. CN III, IV, VI: extraocular movement are normal. No ptosis. CN V: Facial sensation is intact to light touch CN VII: Face is symmetric with normal eye closure  CN VIII: Hearing is normal to causal conversation. CN IX, X: Phonation is normal. CN XI: Head turning and shoulder shrug are intact  MOTOR: Mild bilateral shoulder abduction, mild bilateral hip flexion weakness  REFLEXES: Reflexes are 2+ and symmetric at the biceps, triceps, knees, and ankles. Plantar responses are flexor bilaterally, also has bilateral Hoffmann signs,  SENSORY: Intact to light touch, pinprick and vibratory sensation are intact in fingers and toes.  COORDINATION: There is  no trunk or limb dysmetria noted.  GAIT/STANCE: Able to get up from seated position, cautious  REVIEW OF SYSTEMS:  Full 14 system review of systems performed and notable only for as above All other review of systems were negative.   ALLERGIES: Allergies  Allergen Reactions   Lipitor  [Atorvastatin] Other (See Comments)    REACTION: Sternum aching   Tetracyclines & Related Rash    HOME MEDICATIONS: Current Outpatient Medications  Medication Sig Dispense Refill   aspirin 81 MG tablet Take 81 mg by mouth. Patient takes on Sat, Sun Tues, Thurs     Calcium Carb-Cholecalciferol 600-10 MG-MCG TABS 1 tablet     calcium carbonate (OS-CAL) 600 MG TABS tablet Take 600 mg by mouth 2 (two) times daily with a meal.     Cholecalciferol (VITAMIN D) 2000 UNITS CAPS Take 2,000 Units by mouth daily.     fluticasone (FLONASE) 50 MCG/ACT nasal spray Place into both nostrils daily.     Iron-Vitamin C (VITRON-C) 65-125 MG TABS 1 tablet     LORazepam (ATIVAN) 0.5 MG tablet Take 0.25-0.5 mg by mouth 2 (two) times daily as needed.     losartan (COZAAR) 25 MG tablet Take 25 mg by mouth daily.     Multiple Vitamin (MULTIVITAMIN) capsule Take 1 capsule by mouth daily.     pantoprazole (PROTONIX) 40 MG tablet 1 tablet Orally 3 times per week     REPATHA SURECLICK 140 MG/ML SOAJ INJECT ONE PEN INTO SKIN EVERY 14 DAYS. 6 mL 3   rosuvastatin (CRESTOR) 10 MG tablet Take 10 mg by mouth daily.     tadalafil (CIALIS) 5 MG tablet Take 5 mg by mouth daily as needed for erectile dysfunction.     No current facility-administered medications for this visit.    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-left temple inferior (CX35FU)    PAST SURGICAL HISTORY: Past Surgical History:  Procedure Laterality Date   PROSTATE BIOPSY     skin cancer removals      FAMILY HISTORY: Family History  Problem Relation Age of Onset   Atrial fibrillation Mother    Leukemia Mother    Prostate cancer Father     Heart failure Father    Diabetes Father    Prostate cancer Brother    Lung cancer Sister    Breast cancer Neg Hx    Colon cancer Neg Hx    Pancreatic cancer Neg Hx     SOCIAL HISTORY: Social History   Socioeconomic History   Marital status: Married    Spouse name: Not on file   Number of children: 2   Years of education: Not on file   Highest education level: Not on file  Occupational History   Not on file  Tobacco Use   Smoking status: Never   Smokeless tobacco: Never  Vaping Use   Vaping status: Never Used  Substance and Sexual Activity   Alcohol use: Yes    Alcohol/week: 3.0 standard drinks of alcohol    Types: 1 Glasses of wine, 2 Cans of beer per week    Comment: drinks in a week   Drug use: Never  Sexual activity: Yes  Other Topics Concern   Not on file  Social History Narrative   Not on file   Social Drivers of Health   Financial Resource Strain: Not on file  Food Insecurity: Low Risk  (03/24/2023)   Received from [facility]   Hunger Vital Sign    Worried About Running Out of Food in the Last Year: Never true    Ran Out of Food in the Last Year: Never true  Transportation Needs: No Transportation Needs (03/24/2023)   Received from [facility]    In the past 12 months, has lack of reliable transportation kept you from medical appointments, meetings, work or from getting things needed for daily living? : No  Physical Activity: Not on file  Stress: Not on file  Social Connections: Not on file  Intimate Partner Violence: Not on file      Levert Feinstein, M.D. Ph.D.  [facility] [address] [place], [place] [address] Ph: [phone] Fax: [phone]  CC:  Gweneth Dimitri, MD [address] [place],  [place] [address]  Gweneth Dimitri, MD

## 2023-09-16 NOTE — Procedures (Signed)
 Full Name: Alexander Reynolds Gender: Male MRN #: [id number] Date of Birth: [birth year]    Visit Date: 09/16/2023 09:46 Age: 78 Years Examining Physician: Levert Feinstein Referring Physician: Levert Feinstein Height: 5 feet 10 inch History: 78 year old male complains of a year history of gradual onset unsteady gait, brisk reflex on examination  Summary of the test:  Nerve conduction study:  Right sural, superficial peroneal, median and ulnar sensory responses were normal.  Right peroneal to EDB, tibial, median and ulnar motor responses were normal.  Electromyography:  Selected needle examination of the right upper and lower extremity muscles; cervical and lumbar paraspinal muscles were normal   Conclusion: This is a normal study.  There is no electrodiagnostic evidence of large fiber peripheral neuropathy, intrinsic muscle disease.    Levert Feinstein. M.D. Ph.D.  ----------------------------  [facility] [address] [place], [place] [address] Tel: [phone] Fax: [phone]  Verbal informed consent was obtained from the patient, patient was informed of potential risk of procedure, including bruising, bleeding, hematoma formation, infection, muscle weakness, muscle pain, numbness, among others.        MNC    Nerve / Sites Muscle Latency Ref. Amplitude Ref. Rel Amp Segments Distance Velocity Ref. Area    ms ms mV mV %  cm m/s m/s mVms  R Median - APB     Wrist APB 3.7 <=4.4 7.5 >=4.0 100 Wrist - APB 7   35.8     Upper arm APB 8.0  7.1  95.5 Upper arm - Wrist 24 56 >=49 33.8  R Ulnar - ADM     Wrist ADM 3.1 <=3.3 9.8 >=6.0 100 Wrist - ADM 7   36.1     B.Elbow ADM 5.7  8.9  90.3 B.Elbow - Wrist 16 61 >=49 34.2     A.Elbow ADM 8.4  9.1  103 A.Elbow - B.Elbow 15 56 >=49 36.6  R Peroneal - EDB     Ankle EDB 5.5 <=6.5 2.6 >=2.0 100 Ankle - EDB 9   10.7     Fib head EDB 11.8  1.9  73.4 Fib head - Ankle 28 45 >=44 8.4     Pop fossa EDB 15.1  2.3  116 Pop  fossa - Fib head 15 46 >=44 10.0         Pop fossa - Ankle      R Tibial - AH     Ankle AH 4.2 <=5.8 8.9 >=4.0 100 Ankle - AH 9   17.3     Pop fossa AH 15.1  5.3  59.3 Pop fossa - Ankle 47 43 >=41 14.3             SNC    Nerve / Sites Rec. Site Peak Lat Ref.  Amp Ref. Segments Distance    ms ms V V  cm  R Sural - Ankle (Calf)     Calf Ankle 3.9 <=4.4 11 >=6 Calf - Ankle 14  R Superficial peroneal - Ankle     Lat leg Ankle 3.6 <=4.4 8 >=6 Lat leg - Ankle 14  R Median - Orthodromic (Dig II, Mid palm)     Dig II Wrist 3.3 <=3.4 12 >=10 Dig II - Wrist 13  R Ulnar - Orthodromic, (Dig V, Mid palm)     Dig V Wrist 2.9 <=3.1 10 >=5 Dig V - Wrist 11             F  Wave    Nerve F Lat Ref.   ms ms  R Ulnar - ADM 30.7 <=32.0  R Tibial - AH 49.9 <=56.0         EMG Summary Table    Spontaneous MUAP Recruitment  Muscle IA Fib PSW Fasc Other Amp Dur. Poly Pattern  R. Tibialis anterior Normal None None None _______ Normal Normal Normal Normal  R. Peroneus longus Normal None None None _______ Normal Normal Normal Normal  R. Gastrocnemius (Medial head) Normal None None None _______ Normal Normal Normal Normal  R. Vastus lateralis Normal None None None _______ Normal Normal Normal Normal  R. Tibialis posterior Normal None None None _______ Normal Normal Normal Normal  R. Lumbar paraspinals (low) Normal None None None _______ Normal Normal Normal Normal  R. Lumbar paraspinals (mid) Normal None None None _______ Normal Normal Normal Normal  R. First dorsal interosseous Normal None None None _______ Normal Normal Normal Normal  R. Pronator teres Normal None None None _______ Normal Normal Normal Normal  R. Biceps brachii Normal None None None _______ Normal Normal Normal Normal  R. Deltoid Normal None None None _______ Normal Normal Normal Normal  R. Triceps brachii Normal None None None _______ Normal Normal Normal Normal  R. Thoracic paraspinals Normal None None None _______ Normal Normal  Normal Normal

## 2023-10-08 ENCOUNTER — Other Ambulatory Visit: Payer: Self-pay | Admitting: Cardiology

## 2023-10-13 DIAGNOSIS — D3131 Benign neoplasm of right choroid: Secondary | ICD-10-CM | POA: Diagnosis not present

## 2023-11-09 ENCOUNTER — Telehealth: Payer: Self-pay | Admitting: Cardiology

## 2023-11-09 DIAGNOSIS — E782 Mixed hyperlipidemia: Secondary | ICD-10-CM

## 2023-11-09 DIAGNOSIS — Z8249 Family history of ischemic heart disease and other diseases of the circulatory system: Secondary | ICD-10-CM

## 2023-11-09 DIAGNOSIS — I2510 Atherosclerotic heart disease of native coronary artery without angina pectoris: Secondary | ICD-10-CM

## 2023-11-09 MED ORDER — REPATHA SURECLICK 140 MG/ML ~~LOC~~ SOAJ
140.0000 mg | SUBCUTANEOUS | 0 refills | Status: DC
Start: 2023-11-09 — End: 2024-03-29

## 2023-11-09 NOTE — Telephone Encounter (Signed)
*  STAT* If patient is at the pharmacy, call can be transferred to refill team.   1. Which medications need to be refilled? (please list name of each medication and dose if known)   Evolocumab  (REPATHA  SURECLICK) 140 MG/ML SOAJ   2. Would you like to learn more about the convenience, safety, & potential cost savings by using the [facility]?   3. Are you open to using the [facility] (Type [facility]. ).  4. Which pharmacy/location (including street and city if local pharmacy) is medication to be sent to?  [facility] [place], [place] - [address]   5. Do they need a 30 day or 90 day supply?   Patient still has some medication.  Patient has appointment scheduled with Dr. Renna Cary on 9/15.

## 2023-12-13 DIAGNOSIS — D1801 Hemangioma of skin and subcutaneous tissue: Secondary | ICD-10-CM | POA: Diagnosis not present

## 2023-12-13 DIAGNOSIS — Z85828 Personal history of other malignant neoplasm of skin: Secondary | ICD-10-CM | POA: Diagnosis not present

## 2023-12-13 DIAGNOSIS — L570 Actinic keratosis: Secondary | ICD-10-CM | POA: Diagnosis not present

## 2023-12-13 DIAGNOSIS — L821 Other seborrheic keratosis: Secondary | ICD-10-CM | POA: Diagnosis not present

## 2023-12-13 DIAGNOSIS — L820 Inflamed seborrheic keratosis: Secondary | ICD-10-CM | POA: Diagnosis not present

## 2024-02-08 DIAGNOSIS — E782 Mixed hyperlipidemia: Secondary | ICD-10-CM | POA: Diagnosis not present

## 2024-02-08 DIAGNOSIS — R5383 Other fatigue: Secondary | ICD-10-CM | POA: Diagnosis not present

## 2024-02-08 DIAGNOSIS — D509 Iron deficiency anemia, unspecified: Secondary | ICD-10-CM | POA: Diagnosis not present

## 2024-02-08 DIAGNOSIS — R790 Abnormal level of blood mineral: Secondary | ICD-10-CM | POA: Diagnosis not present

## 2024-02-08 DIAGNOSIS — R7303 Prediabetes: Secondary | ICD-10-CM | POA: Diagnosis not present

## 2024-02-08 DIAGNOSIS — I10 Essential (primary) hypertension: Secondary | ICD-10-CM | POA: Diagnosis not present

## 2024-02-08 DIAGNOSIS — Z79899 Other long term (current) drug therapy: Secondary | ICD-10-CM | POA: Diagnosis not present

## 2024-02-15 DIAGNOSIS — G479 Sleep disorder, unspecified: Secondary | ICD-10-CM | POA: Diagnosis not present

## 2024-02-15 DIAGNOSIS — R790 Abnormal level of blood mineral: Secondary | ICD-10-CM | POA: Diagnosis not present

## 2024-02-15 DIAGNOSIS — D509 Iron deficiency anemia, unspecified: Secondary | ICD-10-CM | POA: Diagnosis not present

## 2024-02-15 DIAGNOSIS — R7989 Other specified abnormal findings of blood chemistry: Secondary | ICD-10-CM | POA: Diagnosis not present

## 2024-02-15 DIAGNOSIS — K219 Gastro-esophageal reflux disease without esophagitis: Secondary | ICD-10-CM | POA: Diagnosis not present

## 2024-02-15 DIAGNOSIS — R7301 Impaired fasting glucose: Secondary | ICD-10-CM | POA: Diagnosis not present

## 2024-02-15 DIAGNOSIS — I10 Essential (primary) hypertension: Secondary | ICD-10-CM | POA: Diagnosis not present

## 2024-02-15 DIAGNOSIS — E782 Mixed hyperlipidemia: Secondary | ICD-10-CM | POA: Diagnosis not present

## 2024-02-15 DIAGNOSIS — Z0000 Encounter for general adult medical examination without abnormal findings: Secondary | ICD-10-CM | POA: Diagnosis not present

## 2024-02-16 ENCOUNTER — Other Ambulatory Visit: Payer: Self-pay | Admitting: Urology

## 2024-02-16 DIAGNOSIS — R972 Elevated prostate specific antigen [PSA]: Secondary | ICD-10-CM

## 2024-02-16 DIAGNOSIS — C61 Malignant neoplasm of prostate: Secondary | ICD-10-CM

## 2024-02-20 ENCOUNTER — Ambulatory Visit: Admitting: Cardiology

## 2024-02-29 ENCOUNTER — Ambulatory Visit
Admission: RE | Admit: 2024-02-29 | Discharge: 2024-02-29 | Disposition: A | Discharge status: Home / Self Care | Source: Ambulatory Visit | Attending: Urology | Admitting: Urology

## 2024-02-29 DIAGNOSIS — R972 Elevated prostate specific antigen [PSA]: Secondary | ICD-10-CM

## 2024-02-29 DIAGNOSIS — C61 Malignant neoplasm of prostate: Secondary | ICD-10-CM

## 2024-02-29 MED ORDER — GADOPICLENOL 0.5 MMOL/ML IV SOLN
7.0000 mL | Freq: Once | INTRAVENOUS | Status: AC | PRN
  Administered 2024-02-29: 7 mL via INTRAVENOUS

## 2024-03-28 ENCOUNTER — Other Ambulatory Visit: Payer: Self-pay | Admitting: Cardiology

## 2024-03-28 DIAGNOSIS — I2510 Atherosclerotic heart disease of native coronary artery without angina pectoris: Secondary | ICD-10-CM

## 2024-03-28 DIAGNOSIS — Z8249 Family history of ischemic heart disease and other diseases of the circulatory system: Secondary | ICD-10-CM

## 2024-03-28 DIAGNOSIS — E782 Mixed hyperlipidemia: Secondary | ICD-10-CM

## 2024-04-20 ENCOUNTER — Encounter: Payer: Self-pay | Admitting: Cardiology

## 2024-04-20 ENCOUNTER — Ambulatory Visit: Attending: Cardiology | Admitting: Cardiology

## 2024-04-20 VITALS — BP 128/74 | HR 62 | Ht 70.0 in | Wt 152.6 lb

## 2024-04-20 DIAGNOSIS — R011 Cardiac murmur, unspecified: Secondary | ICD-10-CM | POA: Diagnosis not present

## 2024-04-20 DIAGNOSIS — Z8249 Family history of ischemic heart disease and other diseases of the circulatory system: Secondary | ICD-10-CM

## 2024-04-20 DIAGNOSIS — E782 Mixed hyperlipidemia: Secondary | ICD-10-CM

## 2024-04-20 DIAGNOSIS — I2510 Atherosclerotic heart disease of native coronary artery without angina pectoris: Secondary | ICD-10-CM

## 2024-04-20 DIAGNOSIS — I10 Essential (primary) hypertension: Secondary | ICD-10-CM

## 2024-04-20 NOTE — Progress Notes (Signed)
 Cardiology Office Note:  .   Date:  04/20/2024  ID:  Alexander Reynolds, DOB Oct 25, 1945, MRN [id number] PCP: Aisha Harvey, MD  [facility] Providers Cardiologist:  Oneil Parchment, MD    History of Present Illness: .   Alexander Reynolds is a 78 y.o. male Discussed the use of AI scribe software History of Present Illness Alexander Reynolds is a 78 year old male with coronary artery disease who presents for a follow-up visit.  He underwent a coronary CT on Oct 06, 2022, which showed a calcium score of 109, placing him in the 35th percentile, with nonobstructive coronary disease and minimal luminal irregularities in the left main and left anterior descending arteries. The patient reports no chest pain, shortness of breath, or other cardiovascular symptoms. He is on Repatha , which has effectively lowered his LDL to 23 in the past. He also takes aspirin four days a week and losartan 25 mg daily for blood pressure management.  He has a history of aortic atherosclerosis, and an echocardiogram from October 05, 2022, showed normal pump function, mild calcification of the aortic valve, mild regurgitation, and grade one diastolic dysfunction. No significant aortic stenosis was noted.  He also has a history of liver lesions, which were reviewed with MRI and identified as two small benign hemangiomas.  His family history is significant for his father having coronary disease. 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 He enjoys attending [employer] ball games, which he notes can cause his blood pressure to spike.  The patient reports no chest pain, shortness of breath, or history of cardiovascular events.       Studies Reviewed: SABRA   EKG Interpretation Date/Time:  Friday April 20 2024 08:36:51 EST Ventricular Rate:  62 PR Interval:  166 QRS Duration:  74 QT Interval:  394 QTC Calculation: 399 R Axis:   72  Text Interpretation: Normal sinus  rhythm Normal ECG No previous ECGs available Confirmed by Parchment Oneil ([id number]) on 04/20/2024 8:40:16 AM    Results LABS LDL: 26 (12/2023) A1c: 5.9 Creatinine: 1.0 ALT: 21  RADIOLOGY Coronary CT: Calcium score 109, 35th percentile, nonobstructive coronary artery disease, minimal luminal irregularities in left main and left atrium (10/06/2022) Liver MRI: Two small benign hemangiomas  DIAGNOSTIC Echocardiogram: Normal ejection fraction, mild calcification of aortic valve, mild aortic regurgitation, no significant aortic stenosis, grade one diastolic dysfunction (10/05/2022) EKG: Normal (04/20/2024) Risk Assessment/Calculations:            Physical Exam:   VS:  BP 128/74   Pulse 62   Ht 5' 10 (1.778 m)   Wt 152 lb 9.6 oz (69.2 kg)   SpO2 98%   BMI 21.90 kg/m    Wt Readings from Last 3 Encounters:  04/20/24 152 lb 9.6 oz (69.2 kg)  09/16/23 154 lb (69.9 kg)  07/07/23 154 lb (69.9 kg)    GEN: Well nourished, well developed in no acute distress NECK: No JVD; No carotid bruits CARDIAC: RRR, soft systolic murmur, no rubs, no gallops RESPIRATORY:  Clear to auscultation without rales, wheezing or rhonchi  ABDOMEN: Soft, non-tender, non-distended EXTREMITIES:  No edema; No deformity   ASSESSMENT AND PLAN: .    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 No current symptoms of chest pain or dyspnea. EKG is normal.  Coronary CT is not recommended unless symptoms develop. - Continue current management with Repatha . - Advised to report any new symptoms such as chest pain or dyspnea.  Aortic valve calcification with mild regurgitation and cardiac murmur Mild calcification and regurgitation of the aortic valve with a soft systolic murmur. No significant aortic stenosis.  Echocardiogram shows normal pump function and grade one diastolic dysfunction. - Continue monitoring with regular follow-ups.  Mixed hyperlipidemia Managed with Repatha , resulting in LDL levels of 26. Previous small particle LDL was significantly high. Current management is effective. - Continue Repatha  for lipid management. - Maintain a Mediterranean diet and regular exercise.  Essential hypertension Well-controlled with losartan. Blood pressure readings are stable. - Continue losartan 50 mg daily.  Aortic atherosclerosis Noted on echocardiogram with mild calcification of the aortic valve. No significant stenosis or narrowing. - Continue monitoring with regular follow-ups.  Prediabetes A1c of 5.9, consistent with previous levels. No acute changes noted. - Continue monitoring A1c levels.         Dispo: 1 yr APP  Signed, Oneil Parchment, MD

## 2024-04-20 NOTE — Patient Instructions (Addendum)
 Medication Instructions:  The current medical regimen is effective;  continue present plan and medications.  *If you need a refill on your cardiac medications before your next appointment, please call your pharmacy*  Follow-Up: At [facility], you and your health needs are our priority.  As part of our continuing mission to provide you with exceptional heart care, our providers are all part of one team.  This team includes your primary Cardiologist (physician) and Advanced Practice Providers or APPs (Physician Assistants and Nurse Practitioners) who all work together to provide you with the care you need, when you need it.  Your next appointment:   1 year(s)  Provider:   Oneil Parchment, MD  or any NP/PA.  We recommend signing up for the patient portal called MyChart.  Sign up information is provided on this After Visit Summary.  MyChart is used to connect with patients for Virtual Visits (Telemedicine).  Patients are able to view lab/test results, encounter notes, upcoming appointments, etc.  Non-urgent messages can be sent to your provider as well.   To learn more about what you can do with MyChart, go to [email or web address].

## 2024-06-04 ENCOUNTER — Other Ambulatory Visit (HOSPITAL_COMMUNITY): Payer: Self-pay | Admitting: Family Medicine

## 2024-06-04 DIAGNOSIS — R319 Hematuria, unspecified: Secondary | ICD-10-CM

## 2024-06-12 ENCOUNTER — Encounter (HOSPITAL_COMMUNITY): Payer: Self-pay

## 2024-06-12 ENCOUNTER — Ambulatory Visit (HOSPITAL_COMMUNITY)
Admission: RE | Admit: 2024-06-12 | Discharge: 2024-06-12 | Disposition: A | Discharge status: Home / Self Care | Source: Ambulatory Visit | Attending: Family Medicine | Admitting: Family Medicine

## 2024-06-12 DIAGNOSIS — R319 Hematuria, unspecified: Secondary | ICD-10-CM | POA: Insufficient documentation

## 2024-06-12 MED ORDER — IOHEXOL 350 MG/ML SOLN
80.0000 mL | Freq: Once | INTRAVENOUS | Status: AC | PRN
  Administered 2024-06-12: 80 mL via INTRAVENOUS

## 2024-06-20 ENCOUNTER — Telehealth: Payer: Self-pay | Admitting: Cardiology

## 2024-06-20 DIAGNOSIS — E782 Mixed hyperlipidemia: Secondary | ICD-10-CM

## 2024-06-20 DIAGNOSIS — Z8249 Family history of ischemic heart disease and other diseases of the circulatory system: Secondary | ICD-10-CM

## 2024-06-20 DIAGNOSIS — I2510 Atherosclerotic heart disease of native coronary artery without angina pectoris: Secondary | ICD-10-CM

## 2024-06-20 MED ORDER — REPATHA SURECLICK 140 MG/ML ~~LOC~~ SOAJ: 140.0000 mg | SUBCUTANEOUS | 1 refills | Status: AC

## 2024-06-20 NOTE — Telephone Encounter (Signed)
 [facility] [place], [place] - [address]     *STAT* If patient is at the pharmacy, call can be transferred to refill team.   1. Which medications need to be refilled? (please list name of each medication and dose if known) REPATHA  SURECLICK 140 MG/ML SOAJ    2. Which pharmacy/location (including street and city if local pharmacy) is medication to be sent to? [facility] [place], [place] - [address]    3. Do they need a 30 day or 90 day supply?  90 day supply + refills if possible
